# Patient Record
Sex: Female | Born: 1970 | Race: White | Hispanic: No | Marital: Married | Smoking: Former smoker
Health system: Southern US, Community
[De-identification: ages and names within clinical notes are randomized; demographics above are authoritative.]

## PROBLEM LIST (undated history)

## (undated) DIAGNOSIS — Z789 Other specified health status: Secondary | ICD-10-CM

## (undated) HISTORY — PX: WISDOM TOOTH EXTRACTION: SHX21

## (undated) HISTORY — PX: TUBAL LIGATION: SHX77

## (undated) HISTORY — PX: DILATION AND CURETTAGE OF UTERUS: SHX78

---

## 2010-11-12 ENCOUNTER — Emergency Department (HOSPITAL_COMMUNITY)
Admission: EM | Admit: 2010-11-12 | Discharge: 2010-11-12 | Disposition: A | Discharge status: Home / Self Care | Payer: 59 | Attending: Emergency Medicine | Admitting: Emergency Medicine

## 2010-11-12 DIAGNOSIS — S91109A Unspecified open wound of unspecified toe(s) without damage to nail, initial encounter: Secondary | ICD-10-CM | POA: Insufficient documentation

## 2010-11-12 DIAGNOSIS — W268XXA Contact with other sharp object(s), not elsewhere classified, initial encounter: Secondary | ICD-10-CM | POA: Insufficient documentation

## 2010-11-12 DIAGNOSIS — Y92009 Unspecified place in unspecified non-institutional (private) residence as the place of occurrence of the external cause: Secondary | ICD-10-CM | POA: Insufficient documentation

## 2011-06-03 ENCOUNTER — Other Ambulatory Visit: Payer: Self-pay | Admitting: Orthopedic Surgery

## 2011-06-03 DIAGNOSIS — M25512 Pain in left shoulder: Secondary | ICD-10-CM

## 2011-06-10 ENCOUNTER — Ambulatory Visit
Admission: RE | Admit: 2011-06-10 | Discharge: 2011-06-10 | Disposition: A | Discharge status: Home / Self Care | Payer: 59 | Source: Ambulatory Visit | Attending: Orthopedic Surgery | Admitting: Orthopedic Surgery

## 2011-06-10 DIAGNOSIS — M25512 Pain in left shoulder: Secondary | ICD-10-CM

## 2012-04-24 ENCOUNTER — Other Ambulatory Visit: Payer: Self-pay | Admitting: Orthopedic Surgery

## 2012-04-24 DIAGNOSIS — M545 Low back pain, unspecified: Secondary | ICD-10-CM

## 2012-04-24 DIAGNOSIS — R531 Weakness: Secondary | ICD-10-CM

## 2012-04-30 ENCOUNTER — Ambulatory Visit
Admission: RE | Admit: 2012-04-30 | Discharge: 2012-04-30 | Disposition: A | Discharge status: Home / Self Care | Payer: 59 | Source: Ambulatory Visit | Attending: Orthopedic Surgery | Admitting: Orthopedic Surgery

## 2012-04-30 DIAGNOSIS — M545 Low back pain, unspecified: Secondary | ICD-10-CM

## 2012-04-30 DIAGNOSIS — R531 Weakness: Secondary | ICD-10-CM

## 2013-10-18 ENCOUNTER — Ambulatory Visit
Admission: RE | Admit: 2013-10-18 | Discharge: 2013-10-18 | Disposition: A | Discharge status: Home / Self Care | Payer: 59 | Source: Ambulatory Visit | Attending: Family Medicine | Admitting: Family Medicine

## 2013-10-18 ENCOUNTER — Other Ambulatory Visit: Payer: Self-pay | Admitting: Family Medicine

## 2013-10-18 DIAGNOSIS — R1012 Left upper quadrant pain: Secondary | ICD-10-CM

## 2013-10-18 MED ORDER — IOHEXOL 300 MG/ML  SOLN
30.0000 mL | Freq: Once | INTRAMUSCULAR | Status: AC | PRN
  Administered 2013-10-18: 30 mL via ORAL

## 2013-10-18 MED ORDER — IOHEXOL 300 MG/ML  SOLN
100.0000 mL | Freq: Once | INTRAMUSCULAR | Status: AC | PRN
  Administered 2013-10-18: 100 mL via INTRAVENOUS

## 2014-01-22 ENCOUNTER — Other Ambulatory Visit: Payer: Self-pay | Admitting: Orthopedic Surgery

## 2014-01-22 DIAGNOSIS — M7989 Other specified soft tissue disorders: Secondary | ICD-10-CM

## 2014-01-29 ENCOUNTER — Other Ambulatory Visit: Payer: 59

## 2014-02-07 ENCOUNTER — Ambulatory Visit
Admission: RE | Admit: 2014-02-07 | Discharge: 2014-02-07 | Disposition: A | Discharge status: Home / Self Care | Payer: 59 | Source: Ambulatory Visit | Attending: Orthopedic Surgery | Admitting: Orthopedic Surgery

## 2014-02-07 DIAGNOSIS — M7989 Other specified soft tissue disorders: Secondary | ICD-10-CM

## 2014-02-07 MED ORDER — GADOBENATE DIMEGLUMINE 529 MG/ML IV SOLN
13.0000 mL | Freq: Once | INTRAVENOUS | Status: AC | PRN
  Administered 2014-02-07: 13 mL via INTRAVENOUS

## 2014-02-14 ENCOUNTER — Other Ambulatory Visit: Payer: Self-pay | Admitting: Orthopedic Surgery

## 2014-02-19 ENCOUNTER — Encounter (HOSPITAL_BASED_OUTPATIENT_CLINIC_OR_DEPARTMENT_OTHER): Payer: Self-pay | Admitting: *Deleted

## 2014-02-21 ENCOUNTER — Encounter (HOSPITAL_BASED_OUTPATIENT_CLINIC_OR_DEPARTMENT_OTHER): Payer: Self-pay | Admitting: *Deleted

## 2014-02-21 ENCOUNTER — Encounter (HOSPITAL_BASED_OUTPATIENT_CLINIC_OR_DEPARTMENT_OTHER): Payer: 59 | Admitting: Anesthesiology

## 2014-02-21 ENCOUNTER — Ambulatory Visit (HOSPITAL_BASED_OUTPATIENT_CLINIC_OR_DEPARTMENT_OTHER): Payer: 59 | Admitting: Anesthesiology

## 2014-02-21 ENCOUNTER — Encounter (HOSPITAL_BASED_OUTPATIENT_CLINIC_OR_DEPARTMENT_OTHER)
Admission: RE | Disposition: A | Discharge status: Home / Self Care | Payer: Self-pay | Source: Ambulatory Visit | Attending: Orthopedic Surgery

## 2014-02-21 ENCOUNTER — Ambulatory Visit (HOSPITAL_BASED_OUTPATIENT_CLINIC_OR_DEPARTMENT_OTHER)
Admission: RE | Admit: 2014-02-21 | Discharge: 2014-02-21 | Disposition: A | Discharge status: Home / Self Care | Payer: 59 | Source: Ambulatory Visit | Attending: Orthopedic Surgery | Admitting: Orthopedic Surgery

## 2014-02-21 DIAGNOSIS — D481 Neoplasm of uncertain behavior of connective and other soft tissue: Secondary | ICD-10-CM | POA: Insufficient documentation

## 2014-02-21 DIAGNOSIS — M6740 Ganglion, unspecified site: Secondary | ICD-10-CM | POA: Insufficient documentation

## 2014-02-21 DIAGNOSIS — Z882 Allergy status to sulfonamides status: Secondary | ICD-10-CM | POA: Insufficient documentation

## 2014-02-21 DIAGNOSIS — D1779 Benign lipomatous neoplasm of other sites: Secondary | ICD-10-CM | POA: Insufficient documentation

## 2014-02-21 DIAGNOSIS — Z87891 Personal history of nicotine dependence: Secondary | ICD-10-CM | POA: Insufficient documentation

## 2014-02-21 DIAGNOSIS — D4819 Other specified neoplasm of uncertain behavior of connective and other soft tissue: Secondary | ICD-10-CM | POA: Insufficient documentation

## 2014-02-21 HISTORY — DX: Other specified health status: Z78.9

## 2014-02-21 HISTORY — PX: MASS EXCISION: SHX2000

## 2014-02-21 LAB — POCT HEMOGLOBIN-HEMACUE: HEMOGLOBIN: 10.3 g/dL — AB (ref 12.0–15.0)

## 2014-02-21 SURGERY — EXCISION MASS
Anesthesia: General | Site: Finger | Laterality: Bilateral

## 2014-02-21 MED ORDER — DEXAMETHASONE SODIUM PHOSPHATE 4 MG/ML IJ SOLN
INTRAMUSCULAR | Status: DC | PRN
  Administered 2014-02-21: 10 mg via INTRAVENOUS

## 2014-02-21 MED ORDER — 0.9 % SODIUM CHLORIDE (POUR BTL) OPTIME
TOPICAL | Status: DC | PRN
  Administered 2014-02-21: 200 mL

## 2014-02-21 MED ORDER — FENTANYL CITRATE 0.05 MG/ML IJ SOLN
INTRAMUSCULAR | Status: AC
  Filled 2014-02-21: qty 4

## 2014-02-21 MED ORDER — LACTATED RINGERS IV SOLN
INTRAVENOUS | Status: DC
  Administered 2014-02-21: 12:00:00 via INTRAVENOUS

## 2014-02-21 MED ORDER — HYDROCODONE-ACETAMINOPHEN 5-325 MG PO TABS: ORAL_TABLET | ORAL | Status: DC

## 2014-02-21 MED ORDER — ONDANSETRON HCL 4 MG/2ML IJ SOLN
INTRAMUSCULAR | Status: DC | PRN
  Administered 2014-02-21: 4 mg via INTRAVENOUS

## 2014-02-21 MED ORDER — BUPIVACAINE HCL (PF) 0.25 % IJ SOLN
INTRAMUSCULAR | Status: DC | PRN
  Administered 2014-02-21: 15 mL

## 2014-02-21 MED ORDER — LIDOCAINE HCL (CARDIAC) 20 MG/ML IV SOLN
INTRAVENOUS | Status: DC | PRN
Start: 2014-02-21 — End: 2014-02-21
  Administered 2014-02-21: 60 mg via INTRAVENOUS

## 2014-02-21 MED ORDER — MIDAZOLAM HCL 5 MG/5ML IJ SOLN
INTRAMUSCULAR | Status: DC | PRN
  Administered 2014-02-21: 2 mg via INTRAVENOUS

## 2014-02-21 MED ORDER — KETOROLAC TROMETHAMINE 30 MG/ML IJ SOLN
INTRAMUSCULAR | Status: DC | PRN
  Administered 2014-02-21: 30 mg via INTRAVENOUS

## 2014-02-21 MED ORDER — OXYCODONE HCL 5 MG PO TABS: 5.0000 mg | ORAL_TABLET | Freq: Once | ORAL | Status: DC | PRN

## 2014-02-21 MED ORDER — ACETAMINOPHEN 325 MG PO TABS: 325.0000 mg | ORAL_TABLET | ORAL | Status: DC | PRN

## 2014-02-21 MED ORDER — MIDAZOLAM HCL 2 MG/2ML IJ SOLN
INTRAMUSCULAR | Status: AC
  Filled 2014-02-21: qty 2

## 2014-02-21 MED ORDER — CEFAZOLIN SODIUM-DEXTROSE 2-3 GM-% IV SOLR
INTRAVENOUS | Status: AC
  Filled 2014-02-21: qty 50

## 2014-02-21 MED ORDER — PROPOFOL 10 MG/ML IV BOLUS
INTRAVENOUS | Status: DC | PRN
  Administered 2014-02-21: 200 mg via INTRAVENOUS

## 2014-02-21 MED ORDER — CEFAZOLIN SODIUM-DEXTROSE 2-3 GM-% IV SOLR
2.0000 g | INTRAVENOUS | Status: AC
  Administered 2014-02-21: 2 g via INTRAVENOUS

## 2014-02-21 MED ORDER — TOBRAMYCIN-DEXAMETHASONE 0.3-0.1 % OP OINT
TOPICAL_OINTMENT | OPHTHALMIC | Status: AC
Start: 2014-02-21 — End: 2014-02-21
  Filled 2014-02-21: qty 7

## 2014-02-21 MED ORDER — FENTANYL CITRATE 0.05 MG/ML IJ SOLN
INTRAMUSCULAR | Status: AC
  Filled 2014-02-21: qty 2

## 2014-02-21 MED ORDER — BUPIVACAINE HCL (PF) 0.25 % IJ SOLN
INTRAMUSCULAR | Status: AC
  Filled 2014-02-21: qty 30

## 2014-02-21 MED ORDER — CHLORHEXIDINE GLUCONATE 4 % EX LIQD
60.0000 mL | Freq: Once | CUTANEOUS | Status: DC
Start: 2014-02-21 — End: 2014-02-21

## 2014-02-21 MED ORDER — MIDAZOLAM HCL 2 MG/2ML IJ SOLN: 1.0000 mg | INTRAMUSCULAR | Status: DC | PRN

## 2014-02-21 MED ORDER — ACETAMINOPHEN 160 MG/5ML PO SOLN: 325.0000 mg | ORAL | Status: DC | PRN

## 2014-02-21 MED ORDER — PROMETHAZINE HCL 25 MG/ML IJ SOLN: 6.2500 mg | INTRAMUSCULAR | Status: DC | PRN

## 2014-02-21 MED ORDER — KETOROLAC TROMETHAMINE 30 MG/ML IJ SOLN: 15.0000 mg | Freq: Once | INTRAMUSCULAR | Status: DC | PRN

## 2014-02-21 MED ORDER — FENTANYL CITRATE 0.05 MG/ML IJ SOLN
INTRAMUSCULAR | Status: DC | PRN
  Administered 2014-02-21: 100 ug via INTRAVENOUS

## 2014-02-21 MED ORDER — FENTANYL CITRATE 0.05 MG/ML IJ SOLN: 50.0000 ug | INTRAMUSCULAR | Status: DC | PRN

## 2014-02-21 MED ORDER — OXYCODONE HCL 5 MG/5ML PO SOLN: 5.0000 mg | Freq: Once | ORAL | Status: DC | PRN

## 2014-02-21 MED ORDER — FENTANYL CITRATE 0.05 MG/ML IJ SOLN
25.0000 ug | INTRAMUSCULAR | Status: DC | PRN
  Administered 2014-02-21: 25 ug via INTRAVENOUS
  Administered 2014-02-21: 50 ug via INTRAVENOUS

## 2014-02-21 SURGICAL SUPPLY — 48 items
BANDAGE COBAN STERILE 2 (GAUZE/BANDAGES/DRESSINGS) ×2 IMPLANT
BANDAGE ELASTIC 3 VELCRO ST LF (GAUZE/BANDAGES/DRESSINGS) IMPLANT
BENZOIN TINCTURE PRP APPL 2/3 (GAUZE/BANDAGES/DRESSINGS) IMPLANT
BLADE MINI RND TIP GREEN BEAV (BLADE) IMPLANT
BLADE SURG 15 STRL LF DISP TIS (BLADE) ×2 IMPLANT
BLADE SURG 15 STRL SS (BLADE) ×2
BNDG COHESIVE 1X5 TAN STRL LF (GAUZE/BANDAGES/DRESSINGS) IMPLANT
BNDG CONFORM 2 STRL LF (GAUZE/BANDAGES/DRESSINGS) ×4 IMPLANT
BNDG ELASTIC 2 VLCR STRL LF (GAUZE/BANDAGES/DRESSINGS) IMPLANT
BNDG ESMARK 4X9 LF (GAUZE/BANDAGES/DRESSINGS) ×2 IMPLANT
BNDG GAUZE 1X2.1 STRL (MISCELLANEOUS) IMPLANT
BNDG GAUZE ELAST 4 BULKY (GAUZE/BANDAGES/DRESSINGS) IMPLANT
BNDG PLASTER X FAST 3X3 WHT LF (CAST SUPPLIES) IMPLANT
CHLORAPREP W/TINT 26ML (MISCELLANEOUS) ×4 IMPLANT
CORDS BIPOLAR (ELECTRODE) ×2 IMPLANT
COVER MAYO STAND STRL (DRAPES) ×4 IMPLANT
COVER TABLE BACK 60X90 (DRAPES) ×2 IMPLANT
CUFF TOURNIQUET SINGLE 18IN (TOURNIQUET CUFF) ×4 IMPLANT
DRAPE EXTREMITY T 121X128X90 (DRAPE) ×2 IMPLANT
DRAPE SURG 17X23 STRL (DRAPES) ×4 IMPLANT
GAUZE SPONGE 4X4 12PLY STRL (GAUZE/BANDAGES/DRESSINGS) ×2 IMPLANT
GAUZE XEROFORM 1X8 LF (GAUZE/BANDAGES/DRESSINGS) ×2 IMPLANT
GLOVE BIO SURGEON STRL SZ7.5 (GLOVE) ×2 IMPLANT
GLOVE BIOGEL PI IND STRL 8 (GLOVE) ×1 IMPLANT
GLOVE BIOGEL PI INDICATOR 8 (GLOVE) ×1
GLOVE SURG SS PI 7.0 STRL IVOR (GLOVE) ×2 IMPLANT
GOWN STRL REUS W/ TWL LRG LVL3 (GOWN DISPOSABLE) ×1 IMPLANT
GOWN STRL REUS W/TWL LRG LVL3 (GOWN DISPOSABLE) ×1
GOWN STRL REUS W/TWL XL LVL3 (GOWN DISPOSABLE) ×2 IMPLANT
NEEDLE HYPO 25X1 1.5 SAFETY (NEEDLE) ×2 IMPLANT
NS IRRIG 1000ML POUR BTL (IV SOLUTION) ×2 IMPLANT
PACK BASIN DAY SURGERY FS (CUSTOM PROCEDURE TRAY) ×2 IMPLANT
PAD CAST 3X4 CTTN HI CHSV (CAST SUPPLIES) IMPLANT
PAD CAST 4YDX4 CTTN HI CHSV (CAST SUPPLIES) IMPLANT
PADDING CAST ABS 4INX4YD NS (CAST SUPPLIES) ×1
PADDING CAST ABS COTTON 4X4 ST (CAST SUPPLIES) ×1 IMPLANT
PADDING CAST COTTON 3X4 STRL (CAST SUPPLIES)
PADDING CAST COTTON 4X4 STRL (CAST SUPPLIES)
SLEEVE SCD COMPRESS KNEE MED (MISCELLANEOUS) ×2 IMPLANT
STOCKINETTE 4X48 STRL (DRAPES) ×4 IMPLANT
SUT ETHILON 3 0 PS 1 (SUTURE) IMPLANT
SUT ETHILON 4 0 PS 2 18 (SUTURE) IMPLANT
SUT ETHILON 5 0 P 3 18 (SUTURE) ×2
SUT NYLON ETHILON 5-0 P-3 1X18 (SUTURE) ×2 IMPLANT
SYR BULB 3OZ (MISCELLANEOUS) ×2 IMPLANT
SYR CONTROL 10ML LL (SYRINGE) ×2 IMPLANT
TOWEL OR 17X24 6PK STRL BLUE (TOWEL DISPOSABLE) ×4 IMPLANT
UNDERPAD 30X30 INCONTINENT (UNDERPADS AND DIAPERS) ×4 IMPLANT

## 2014-02-21 NOTE — Anesthesia Postprocedure Evaluation (Signed)
  Anesthesia Post-op Note  Patient: Careers information officer  Procedure(s) Performed: Procedure(s): RIGHT INDEX EXCISION MASS/LEFT FINGER EXCISION MASS (Bilateral)  Patient Location: PACU  Anesthesia Type:General  Level of Consciousness: awake and alert   Airway and Oxygen Therapy: Patient Spontanous Breathing  Post-op Pain: mild  Post-op Assessment: Post-op Vital signs reviewed, Patient's Cardiovascular Status Stable, Respiratory Function Stable, Patent Airway, No signs of Nausea or vomiting and Pain level controlled  Post-op Vital Signs: Reviewed and stable  Last Vitals:  Filed Vitals:   02/21/14 1645  BP: 124/79  Pulse: 59  Temp:   Resp: 15    Complications: No apparent anesthesia complications

## 2014-02-21 NOTE — Discharge Instructions (Addendum)

## 2014-02-21 NOTE — Anesthesia Procedure Notes (Signed)
Procedure Name: LMA Insertion Performed by: Terrance Mass Pre-anesthesia Checklist: Patient identified, Timeout performed, Emergency Drugs available, Suction available and Patient being monitored Oxygen Delivery Method: Circle system utilized Preoxygenation: Pre-oxygenation with 100% oxygen Intubation Type: IV induction Ventilation: Mask ventilation without difficulty LMA: LMA inserted LMA Size: 4.0 Number of attempts: 1 Placement Confirmation: positive ETCO2 Tube secured with: Tape Dental Injury: Teeth and Oropharynx as per pre-operative assessment

## 2014-02-21 NOTE — Op Note (Signed)
[id number] 

## 2014-02-21 NOTE — Brief Op Note (Signed)
02/21/2014  4:01 PM  PATIENT:  Tiffany Mccullough  43 y.o. female  PRE-OPERATIVE DIAGNOSIS:  right index finger mass/left small finger mass  POST-OPERATIVE DIAGNOSIS:  right index finger mass/left small finger mass  PROCEDURE:  Procedure(s): RIGHT INDEX EXCISION MASS/LEFT FINGER EXCISION MASS (Bilateral)  SURGEON:  Surgeon(s) and Role:    * Tennis Must, MD - Primary  PHYSICIAN ASSISTANT:   ASSISTANTS: none   ANESTHESIA:   general  EBL:  Total I/O In: 1650 [I.V.:1650] Out: -   BLOOD ADMINISTERED:none  DRAINS: none   LOCAL MEDICATIONS USED:  MARCAINE     SPECIMEN:  Source of Specimen:  left small finger x 1, right index finger x 2  DISPOSITION OF SPECIMEN:  PATHOLOGY  COUNTS:  YES  TOURNIQUET:   Total Tourniquet Time Documented: Forearm (Left) - 9 minutes Forearm (Left) - 38 minutes Total: Forearm (Left) - 47 minutes   DICTATION: .Other Dictation: Dictation Number [id number]  PLAN OF CARE: Discharge to home after PACU  PATIENT DISPOSITION:  PACU - hemodynamically stable.

## 2014-02-21 NOTE — Anesthesia Preprocedure Evaluation (Addendum)
Anesthesia Evaluation  Patient identified by MRN, date of birth, ID band Patient awake    Reviewed: Allergy & Precautions, H&P , NPO status , Patient's Chart, lab work & pertinent test results  History of Anesthesia Complications Negative for: history of anesthetic complications  Airway Mallampati: II TM Distance: >3 FB Neck ROM: Full    Dental   Pulmonary neg shortness of breath, neg sleep apnea, neg COPDneg recent URI, former smoker,  breath sounds clear to auscultation        Cardiovascular negative cardio ROS  Rhythm:Regular     Neuro/Psych negative neurological ROS  negative psych ROS   GI/Hepatic negative GI ROS, Neg liver ROS,   Endo/Other  negative endocrine ROS  Renal/GU negative Renal ROS     Musculoskeletal   Abdominal   Peds  Hematology negative hematology ROS (+)   Anesthesia Other Findings   Reproductive/Obstetrics                          Anesthesia Physical Anesthesia Plan  ASA: I  Anesthesia Plan: General   Post-op Pain Management:    Induction: Intravenous  Airway Management Planned: LMA  Additional Equipment: None  Intra-op Plan:   Post-operative Plan: Extubation in OR  Informed Consent: I have reviewed the patients History and Physical, chart, labs and discussed the procedure including the risks, benefits and alternatives for the proposed anesthesia with the patient or authorized representative who has indicated his/her understanding and acceptance.   Dental advisory given  Plan Discussed with: CRNA and Surgeon  Anesthesia Plan Comments:         Anesthesia Quick Evaluation

## 2014-02-21 NOTE — H&P (Signed)
  Tiffany Mccullough is an 43 y.o. female.   Chief Complaint: right index and left small finger masses HPI: 43 yo rhd female with masses right index and left small fingers x 1 year.  No injury noted.  They are bothersome to her and she wishes to have them removed.  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  Past Medical History  Diagnosis Date  . Medical history non-contributory     Past Surgical History  Procedure Laterality Date  . Wisdom tooth extraction    . Dilation and curettage of uterus    . Tubal ligation      tubal implants    History reviewed. No pertinent family history. Social History:  reports that she quit smoking about 11 years ago. She does not have any smokeless tobacco history on file. She reports that she drinks alcohol. Her drug history is not on file.  Allergies:  Allergies  Allergen Reactions  . Sulfa Antibiotics Rash    Medications Prior to Admission  Medication Sig Dispense Refill  . Multiple Vitamins-Minerals (MULTIVITAMIN WITH MINERALS) tablet Take 1 tablet by mouth daily.        Results for orders placed during the hospital encounter of 02/21/14 (from the past 48 hour(s))  POCT HEMOGLOBIN-HEMACUE     Status: Abnormal   Collection Time    02/21/14 12:23 PM      Result Value Ref Range   Hemoglobin 10.3 (*) 12.0 - 15.0 g/dL    No results found.   A comprehensive review of systems was negative except for: Eyes: positive for contacts/glasses  Blood pressure 119/77, pulse 66, temperature 98.2 F (36.8 C), temperature source Oral, resp. rate 16, height 5\' 3"  (1.6 m), weight 67.756 kg (149 lb 6 oz), last menstrual period 02/20/2014, SpO2 100.00%.  General appearance: alert, cooperative and appears stated age Head: Normocephalic, without obvious abnormality, atraumatic Neck: supple, symmetrical, trachea midline Resp: clear to auscultation bilaterally Cardio: regular rate and rhythm GI: non tender Extremities: intact sensation and  capillary refill all digits.  right index and left small with firm masses.  +epl/fpl/io.  no wounds. Pulses: 2+ and symmetric Skin: Skin color, texture, turgor normal. No rashes or lesions Neurologic: Grossly normal Incision/Wound: none  Assessment/Plan Right index and left small finger masses.  Non operative and operative treatment options were discussed with the patient and patient wishes to proceed with operative treatment. Risks, benefits, and alternatives of surgery were discussed and the patient agrees with the plan of care.   Tennis Must 02/21/2014, 2:37 PM

## 2014-02-21 NOTE — Transfer of Care (Signed)
Immediate Anesthesia Transfer of Care Note  Patient: Tiffany Mccullough  Procedure(s) Performed: Procedure(s): RIGHT INDEX EXCISION MASS/LEFT FINGER EXCISION MASS (Bilateral)  Patient Location: PACU  Anesthesia Type:General  Level of Consciousness: awake  Airway & Oxygen Therapy: Patient Spontanous Breathing and Patient connected to face mask oxygen  Post-op Assessment: Report given to PACU RN and Post -op Vital signs reviewed and stable  Post vital signs: Reviewed and stable  Complications: No apparent anesthesia complications

## 2014-02-22 NOTE — Op Note (Signed)
NAMEERIAL, FIKES NO.:  [id number]  MEDICAL RECORD NO.:  [id number]  LOCATION:                                 FACILITY:  PHYSICIAN:  Leanora Cover, MD             DATE OF BIRTH:  DATE OF PROCEDURE:  02/21/2014 DATE OF DISCHARGE:                              OPERATIVE REPORT   PREOPERATIVE DIAGNOSES:  Right index finger mass and left small finger mass.  POSTOPERATIVE DIAGNOSES:  Left small finger lipoma, right index finger annular ligament cyst, and right index finger giant cell tumor.  PROCEDURE:   1. Excision of mass, left small finger 19 mm 2. Excision of mass right index finger of greater than 2 cm 3. Excision of right index finger annular ligament cyst.  SURGEON:  Leanora Cover, MD  ASSISTANT:  None.  ANESTHESIA:  General.  IV FLUIDS:  Per anesthesia flow sheet.  ESTIMATED BLOOD LOSS:  Minimal.  COMPLICATIONS:  None.  SPECIMENS:  Left small finger mass to Pathology.  Right index finger masses x2 to Pathology.  TOURNIQUET TIME:  9 minutes on the left, 38 minutes on the right.  DISPOSITION:  Stable to PACU.  INDICATIONS:  Ms. Pho is a 43 year old female who has had masses in the left small finger and right index finger for approximately 1 year.  These are bothersome to her.  She wishes to have them removed. MRI of the right index finger is consistent with giant cell tumor.  I discussed the nature of the conditions with Ms. Cortina.  Risks, benefits, and alternatives of surgery were discussed including risk of blood loss, infection, damage to nerves, vessels, tendons, ligaments, bone; failure of surgery; need for additional surgery, complications with wound healing, continued pain, and recurrence of the masses.  She voiced understanding of these risks and elected to proceed.  OPERATIVE COURSE:  After being identified preoperatively by myself, the patient and I agreed upon procedure and site of procedure.  Surgical site was marked.   The risks, benefits, and alternatives of surgery were reviewed and she wished to proceed.  Surgical consent had been signed. She was given IV Ancef as a preoperative antibiotic prophylaxis.  She was transported to the operating room and placed on the operating room table in a supine position with the left upper extremity on arm board. General anesthesia was induced by Anesthesiology.  Left upper extremity was prepped and draped in normal sterile orthopedic fashion.  Surgical pause was performed between surgeons, anesthesia, and operating room staff, and all were in agreement as to the patient, procedure, and site of procedure.  Tourniquet at the proximal aspect of the extremity was inflated to 250 mmHg after exsanguination of the limb with an Esmarch bandage.  Incision was made at the volar aspect of the proximal phalanx of the small finger.  This was carried into subcutaneous tissues by spreading technique.  A fatty mass was encountered and was easily removed.  It was not adherent.  The wound was inspected.  No remaining mass was found.  The wound was copiously irrigated with sterile saline. It was then closed with 5-0 nylon in a horizontal mattress fashion.  The wound was injected with 5 mL of 0.25% plain Marcaine to aid in postoperative analgesia.  It was dressed with sterile Xeroform, 4 x 4 and wrapped with Coban dressing lightly.  Tourniquet was deflated at 9 minutes.  The fingertips were pink with brisk capillary refill after deflation of the tourniquet.  Attention was turned to the right upper extremity.  The right upper extremity was prepped and draped in normal sterile orthopedic fashion.  A surgical pause was again performed between surgeons, anesthesia, and operating room staff, and all were in agreement as to the patient, procedure, and site of procedure. Tourniquet at the proximal aspect of the forearm was inflated to 250 mmHg after exsanguination of the limb with an Esmarch  bandage.  A Brunner-type incision was made at the volar aspect of the index finger and carried into the subcutaneous tissues by spreading technique.  There was a cyst from the interval between the A1 and A2 pulleys.  This was removed and sent to Pathology for examination.  A small window of the pulley was removed to try and prevent recurrence of the cyst.  The mass on the dorsal radial aspect of the finger was palpated.  Incision was made over this dorsally.  This was carried into subcutaneous tissues by spreading technique.  Bipolar electrocautery was used to obtain hemostasis.  Mass was encountered that was underneath the extensor tendon.  The extensor tendon was lifted and the mass freed up from its soft tissue attachments.  Dorsal branch of the radial digital nerve went through the mass and this was freed up.  The mass was removed in its entirety.  There did not appear to be mass coming from the flexor sheath or from the PIP joint.  Mass was sent to Pathology for examination.  It was greater than 2 cm in size.  The wounds were copiously irrigated with sterile saline.  They were then closed with 5-0 nylon in a horizontal mattress fashion.  They were injected with 10 mL of 0.25% plain Marcaine to aid in postoperative analgesia.  They were then dressed with sterile Xeroform, 4x4s, and wrapped with a Coban dressing lightly.  Tourniquet was deflated at 38 minutes.  Fingertips were pink with brisk capillary refill after deflation of the tourniquet.  Operative drapes were broken down.  The patient was awoken from anesthesia safely.  She was transferred back to the stretcher and taken to the PACU in a stable condition.  I will see her back in the office 1 week for postoperative followup.  I will give her [place], one to two p.o. q.6 hours p.r.n. pain, dispensed #[phone].     Leanora Cover, MD     KK/MEDQ  D:  02/21/2014  T:  02/22/2014  Job:  [id number]

## 2014-02-26 ENCOUNTER — Encounter (HOSPITAL_BASED_OUTPATIENT_CLINIC_OR_DEPARTMENT_OTHER): Payer: Self-pay | Admitting: Orthopedic Surgery

## 2014-12-01 ENCOUNTER — Other Ambulatory Visit: Payer: Self-pay | Admitting: Orthopedic Surgery

## 2014-12-01 DIAGNOSIS — M25512 Pain in left shoulder: Secondary | ICD-10-CM

## 2014-12-18 ENCOUNTER — Ambulatory Visit
Admission: RE | Admit: 2014-12-18 | Discharge: 2014-12-18 | Disposition: A | Discharge status: Home / Self Care | Payer: 59 | Source: Ambulatory Visit | Attending: Orthopedic Surgery | Admitting: Orthopedic Surgery

## 2014-12-18 DIAGNOSIS — M25512 Pain in left shoulder: Secondary | ICD-10-CM

## 2014-12-18 MED ORDER — IOHEXOL 180 MG/ML  SOLN
20.0000 mL | Freq: Once | INTRAMUSCULAR | Status: AC | PRN
  Administered 2014-12-18: 20 mL via INTRA_ARTICULAR

## 2020-12-17 ENCOUNTER — Other Ambulatory Visit: Payer: Self-pay

## 2020-12-17 ENCOUNTER — Ambulatory Visit: Payer: Managed Care, Other (non HMO) | Admitting: Orthopedic Surgery

## 2020-12-17 ENCOUNTER — Encounter: Payer: Self-pay | Admitting: Orthopedic Surgery

## 2020-12-17 ENCOUNTER — Ambulatory Visit (INDEPENDENT_AMBULATORY_CARE_PROVIDER_SITE_OTHER): Payer: Managed Care, Other (non HMO)

## 2020-12-17 DIAGNOSIS — M25512 Pain in left shoulder: Secondary | ICD-10-CM | POA: Diagnosis not present

## 2020-12-17 DIAGNOSIS — G8929 Other chronic pain: Secondary | ICD-10-CM | POA: Diagnosis not present

## 2020-12-17 DIAGNOSIS — Z96612 Presence of left artificial shoulder joint: Secondary | ICD-10-CM

## 2020-12-17 NOTE — Progress Notes (Signed)
Office Visit Note   Patient: Tiffany Mccullough           Date of Birth: 1971-10-10           MRN: [id number] Visit Date: 12/17/2020 Requested by: Alroy Dust, L.Marlou Sa, [place] [address] [employer] [address] [place],  [place] [address] PCP: Alroy Dust, L.Marlou Sa, MD  Subjective: Chief Complaint  Patient presents with  . Left Shoulder - Pain    HPI: Abe People is a 50 year old patient with left shoulder pain.  Her old notes are reviewed.  She had shoulder pain in 2016 with MRI scan which showed some tendinitis.  Did well with injection.  Now she is having more mechanical symptoms in the shoulder for the last 6 months.  Reports decreased range of motion decreased strength.  Pain wakes her from sleep at night.  She has popping.  She does work as a Engineering geologist.  This involves a lot of overhead moving which does hurt her shoulder particular going across her body.              ROS: All systems reviewed are negative as they relate to the chief complaint within the history of present illness.  Patient denies  fevers or chills.   Assessment & Plan: Visit Diagnoses:  1. Chronic left shoulder pain     Plan: Impression is left shoulder pain with new mechanical symptoms on exam.  Rotator cuff strength is intact.  I think this likely represents labral pathology.  Radiographs are normal.  Ibuprofen has not helped.  She has tried activity modification along with rest as well as home rehab type program without relief.  Plan MRI arthrogram left shoulder to evaluate for SLAP tear versus rotator cuff tendon tear.  I think SLAP tear is most likely based on exam today.  Follow-up after that study.  Follow-Up Instructions: Return for after MRI.   Orders:  Orders Placed This Encounter  Procedures  . XR Shoulder Left   No orders of the defined types were placed in this encounter.     Procedures: No procedures performed   Clinical Data: No additional findings.  Objective: Vital Signs: There were no vitals  taken for this visit.  Physical Exam:   Constitutional: Patient appears well-developed HEENT:  Head: Normocephalic Eyes:EOM are normal Neck: Normal range of motion Cardiovascular: Normal rate Pulmonary/chest: Effort normal Neurologic: Patient is alert Skin: Skin is warm Psychiatric: Patient has normal mood and affect    Ortho Exam: Ortho exam demonstrates full active and passive range of motion of the left shoulder.  She has some coarse grinding and popping intra-articular with internal/external rotation at 90 degrees of abduction.  Rotator cuff strength intact infraspinatus supraspinatus and subscap muscle testing.  No masses lymphadenopathy or skin changes noted in that shoulder girdle region.  O'Brien's testing is positive.  No discrete AC joint tenderness on the left side.  Negative apprehension relocation testing.  Specialty Comments:  No specialty comments available.  Imaging: XR Shoulder Left  Result Date: 12/17/2020 AP axillary outlet left shoulder reviewed.  No acute fracture.  Shoulder joint is reduced.  No AC joint or glenohumeral joint arthritis.  Acromiohumeral distance maintained.  Patient has lung fields clear.  Normal left shoulder radiographs    PMFS History: There are no problems to display for this patient.  Past Medical History:  Diagnosis Date  . Medical history non-contributory     History reviewed. No pertinent family history.  Past Surgical History:  Procedure Laterality Date  .  DILATION AND CURETTAGE OF UTERUS    . MASS EXCISION Bilateral 02/21/2014   Procedure: RIGHT INDEX EXCISION MASS/LEFT FINGER EXCISION MASS;  Surgeon: Tennis Must, MD;  Location: [facility];  Service: Orthopedics;  Laterality: Bilateral;  . TUBAL LIGATION     tubal implants  . WISDOM TOOTH EXTRACTION     Social History   Occupational History  . Not on file  Tobacco Use  . Smoking status: Former Smoker    Quit date: 01/21/2003    Years since quitting:  17.9  . Smokeless tobacco: Not on file  Substance and Sexual Activity  . Alcohol use: Yes  . Drug use: Not on file  . Sexual activity: Not on file

## 2021-01-06 ENCOUNTER — Telehealth: Payer: Self-pay | Admitting: Orthopedic Surgery

## 2021-01-06 ENCOUNTER — Other Ambulatory Visit: Payer: Self-pay | Admitting: Surgical

## 2021-01-06 MED ORDER — DIAZEPAM 5 MG PO TABS: 5.0000 mg | ORAL_TABLET | Freq: Once | ORAL | 0 refills | Status: AC

## 2021-01-06 NOTE — Telephone Encounter (Signed)
Please advise 

## 2021-01-06 NOTE — Telephone Encounter (Signed)
Pt called stating she has an MRI on 01/09/21 and would like something to help with her anxiety called in; pt would like to be updated if this is possible or not.   [phone]

## 2021-01-06 NOTE — Telephone Encounter (Signed)
Sent in RX

## 2021-01-09 ENCOUNTER — Other Ambulatory Visit: Payer: Self-pay

## 2021-01-09 ENCOUNTER — Ambulatory Visit
Admission: RE | Admit: 2021-01-09 | Discharge: 2021-01-09 | Disposition: A | Discharge status: Home / Self Care | Payer: Managed Care, Other (non HMO) | Source: Ambulatory Visit | Attending: Orthopedic Surgery | Admitting: Orthopedic Surgery

## 2021-01-09 DIAGNOSIS — Z96612 Presence of left artificial shoulder joint: Secondary | ICD-10-CM

## 2021-01-25 ENCOUNTER — Ambulatory Visit: Payer: Self-pay

## 2021-01-25 ENCOUNTER — Ambulatory Visit (INDEPENDENT_AMBULATORY_CARE_PROVIDER_SITE_OTHER): Payer: Managed Care, Other (non HMO) | Admitting: Orthopedic Surgery

## 2021-01-25 DIAGNOSIS — M25512 Pain in left shoulder: Secondary | ICD-10-CM | POA: Diagnosis not present

## 2021-01-25 DIAGNOSIS — G8929 Other chronic pain: Secondary | ICD-10-CM

## 2021-01-25 DIAGNOSIS — M19012 Primary osteoarthritis, left shoulder: Secondary | ICD-10-CM

## 2021-01-31 ENCOUNTER — Encounter: Payer: Self-pay | Admitting: Orthopedic Surgery

## 2021-01-31 MED ORDER — METHYLPREDNISOLONE ACETATE 40 MG/ML IJ SUSP
13.3300 mg | INTRAMUSCULAR | Status: AC | PRN
  Administered 2021-01-25: 13.33 mg via INTRA_ARTICULAR

## 2021-01-31 MED ORDER — LIDOCAINE HCL 1 % IJ SOLN
3.0000 mL | INTRAMUSCULAR | Status: AC | PRN
  Administered 2021-01-25: 3 mL

## 2021-01-31 MED ORDER — BUPIVACAINE HCL 0.25 % IJ SOLN
0.6600 mL | INTRAMUSCULAR | Status: AC | PRN
  Administered 2021-01-25: .66 mL via INTRA_ARTICULAR

## 2021-01-31 NOTE — Progress Notes (Signed)
Office Visit Note   Patient: Tiffany Mccullough           Date of Birth: [birth year]           MRN: [id number] Visit Date: 01/25/2021 Requested by: Alroy Dust, L.Marlou Sa, [place] [address] [place],  [place] [address] PCP: Alroy Dust, L.Marlou Sa, MD  Subjective: Chief Complaint  Patient presents with  . Other    Scan review    HPI: Abe People is a 50 year old patient with left superior shoulder pain.  Ibuprofen has not been helpful.  Hard for her to sleep on the left-hand side.  She does x-rays at [facility] which are portables which does tax her shoulder to some degree.  2 prior injections have helped.  MRI scan is reviewed today.  Rotator cuff intact.  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              ROS: All systems reviewed are negative as they relate to the chief complaint within the history of present illness.  Patient denies  fevers or chills.   Assessment & Plan: Visit Diagnoses:  1. Chronic left shoulder pain     Plan: Impression is left shoulder pain with AC joint arthropathy.  Last injection many months ago.  Patient does not really want to consider operative intervention yet.  She may actually do a less demanding physical job.  Ultrasound-guided injection performed today.  Follow-up as needed but I think surgical consideration could be given if this injection does not last.  Follow-Up Instructions: No follow-ups on file.   Orders:  Orders Placed This Encounter  Procedures  . US Guided Needle Placement - No Linked Charges   No orders of the defined types were placed in this encounter.     Procedures: Medium Joint Inj: L acromioclavicular on 01/25/2021 4:00 PM Indications: diagnostic evaluation and pain Details: 25 G 1.5 in needle, ultrasound-guided superior approach Medications: 3 mL lidocaine 1 %; 0.66 mL bupivacaine 0.25 %; 13.33 mg methylPREDNISolone acetate 40 MG/ML Outcome: tolerated well, no immediate  complications Procedure, treatment alternatives, risks and benefits explained, specific risks discussed. Consent was given by the patient. Immediately prior to procedure a time out was called to verify the correct patient, procedure, equipment, support staff and site/side marked as required. Patient was prepped and draped in the usual sterile fashion.       Clinical Data: No additional findings.  Objective: Vital Signs: There were no vitals taken for this visit.  Physical Exam:   Constitutional: Patient appears well-developed HEENT:  Head: Normocephalic Eyes:EOM are normal Neck: Normal range of motion Cardiovascular: Normal rate Pulmonary/chest: Effort normal Neurologic: Patient is alert Skin: Skin is warm Psychiatric: Patient has normal mood and affect    Ortho Exam: Ortho exam demonstrates full active and passive range of motion on that left-hand side.  No restriction of external rotation 15 degrees of abduction left versus right.  Rotator cuff strength on the left is excellent infraspinatus supraspinatus and subscap muscle testing.  AC joint is tender to palpation on the left not on the right.  No masses lymphadenopathy or skin changes noted in that left shoulder girdle region.  Specialty Comments:  No specialty comments available.  Imaging: No results found.   PMFS History: There are no problems to display for this patient.  Past Medical History:  Diagnosis Date  . Medical history non-contributory     No family history on file.  Past Surgical History:  Procedure Laterality Date  . DILATION AND CURETTAGE OF UTERUS    . MASS EXCISION Bilateral 02/21/2014   Procedure: RIGHT INDEX EXCISION MASS/LEFT FINGER EXCISION MASS;  Surgeon: Tennis Must, MD;  Location: [facility];  Service: Orthopedics;  Laterality: Bilateral;  . TUBAL LIGATION     tubal implants  . WISDOM TOOTH EXTRACTION     Social History   Occupational History  . Not on file  Tobacco  Use  . Smoking status: Former Smoker    Quit date: 01/21/2003    Years since quitting: 18.0  . Smokeless tobacco: Not on file  Substance and Sexual Activity  . Alcohol use: Yes  . Drug use: Not on file  . Sexual activity: Not on file

## 2021-03-25 ENCOUNTER — Ambulatory Visit: Payer: Managed Care, Other (non HMO) | Admitting: Orthopedic Surgery

## 2021-03-25 ENCOUNTER — Ambulatory Visit (INDEPENDENT_AMBULATORY_CARE_PROVIDER_SITE_OTHER): Payer: Managed Care, Other (non HMO)

## 2021-03-25 ENCOUNTER — Other Ambulatory Visit: Payer: Self-pay

## 2021-03-25 ENCOUNTER — Encounter: Payer: Self-pay | Admitting: Orthopedic Surgery

## 2021-03-25 DIAGNOSIS — M79605 Pain in left leg: Secondary | ICD-10-CM

## 2021-03-25 DIAGNOSIS — M5410 Radiculopathy, site unspecified: Secondary | ICD-10-CM

## 2021-03-25 NOTE — Progress Notes (Signed)
Office Visit Note   Patient: Tiffany Mccullough           Date of Birth: 12-May-1971           MRN: [id number] Visit Date: 03/25/2021 Requested by: Alroy Dust, L.Marlou Sa, [employer] [place],  [place] [address] PCP: Alroy Dust, L.Marlou Sa, MD  Subjective: Chief Complaint  Patient presents with   Left Leg - Pain    HPI: Tiffany Mccullough is a 50 y.o. female who presents to the office complaining of left hip and buttock pain.  Patient reports a fall December 2019 when she fell and felt a pop in her left hip.  She notes difficulty weightbearing for several days after that incident and she went to a walk-in clinic at the time where she had negative radiographs.  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  This involves a lot of walking and she states that her pain is never really gone away since this initial injury.  Complains of lateral left hip pain as well as anterior groin pain.  She also has associated buttock pain that radiates down to her knee and occasionally down to the bottom of her foot.  She has no history of hip or back surgery but she has had lumbar spine ESI's years ago with last MRI of the lumbar spine in 2013.  She notes pain in the lateral hip with pushing off and she feels pain is so severe that she cannot even do a stationary bike.  [place] is particularly hard and down hills is not difficult.  She notes her leg will go numb at night especially in the lateral thigh.  Pain bothers her every day.  She has associated low back pain..                ROS: All systems reviewed are negative as they relate to the chief complaint within the history of present illness.  Patient denies fevers or chills.  Assessment & Plan: Visit Diagnoses:  1. Pain in left leg   2. Radicular syndrome of left leg     Plan: Patient is a 50 year old female who presents complaining of left hip and low back pain with  left leg radicular pain.  She has had pain for 2 years in the left hip in particular since a fall with negative radiographs at the time.  Radiographs taken today are negative for any pathology to explain her pain.  Additionally, she has what sounds like radicular pain by her history.  Given the radicular pain down her leg as well as her history of lumbar spine ESI in the past, plan to order new MRI of the lumbar spine and MRI of the left hip for further evaluation.  Follow-up after MRI to review results.  Patient agreed with plan.  Follow-Up Instructions: No follow-ups on file.   Orders:  Orders Placed This Encounter  Procedures   XR Lumbar Spine 2-3 Views   XR HIP UNILAT W OR W/O PELVIS 2-3 VIEWS LEFT   MR Lumbar Spine w/o contrast   MR Hip Left w/o contrast   No orders of the defined types were placed in this encounter.     Procedures: No procedures performed   Clinical Data: No additional findings.  Objective: Vital Signs: There were no vitals taken for this visit.  Physical Exam:  Constitutional: Patient appears well-developed HEENT:  Head: Normocephalic  Eyes:EOM are normal Neck: Normal range of motion Cardiovascular: Normal rate Pulmonary/chest: Effort normal Neurologic: Patient is alert Skin: Skin is warm Psychiatric: Patient has normal mood and affect  Ortho Exam: Ortho exam demonstrates negative straight leg raise bilaterally.  Moderate tenderness over the greater trochanter on the left.  No tenderness over the trochanter on the right.  Increased pain in the left groin with internal rotation of the hip and forward flexion of the hip.  Positive Stinchfield sign.  No tenderness throughout the axial lumbar spine.  Normoreflexic patellar tendon reflexes bilaterally.  No calf tenderness.  Negative Homans' sign.  5/5 motor strength of bilateral hip flexor, quadricep, hamstring, dorsiflexion, plantarflexion.  Specialty Comments:  No specialty comments  available.  Imaging: No results found.   PMFS History: There are no problems to display for this patient.  Past Medical History:  Diagnosis Date   Medical history non-contributory     No family history on file.  Past Surgical History:  Procedure Laterality Date   DILATION AND CURETTAGE OF UTERUS     MASS EXCISION Bilateral 02/21/2014   Procedure: RIGHT INDEX EXCISION MASS/LEFT FINGER EXCISION MASS;  Surgeon: Tennis Must, MD;  Location: [facility];  Service: Orthopedics;  Laterality: Bilateral;   TUBAL LIGATION     tubal implants   WISDOM TOOTH EXTRACTION     Social History   Occupational History   Not on file  Tobacco Use   Smoking status: Former    Pack years: 0.00    Types: Cigarettes    Quit date: 01/21/2003    Years since quitting: 18.1   Smokeless tobacco: Not on file  Substance and Sexual Activity   Alcohol use: Yes   Drug use: Not on file   Sexual activity: Not on file

## 2021-04-07 ENCOUNTER — Telehealth: Payer: Self-pay | Admitting: Orthopedic Surgery

## 2021-04-07 NOTE — Telephone Encounter (Signed)
Patient called. Says her insurance denied her MRI. Would like to know what she should do. Her call back number is [phone]

## 2021-04-07 NOTE — Telephone Encounter (Signed)
Has ac joint arthropathy has had scan

## 2021-04-10 NOTE — Telephone Encounter (Signed)
Luke ordered them she may need to try and fail 6 weeks of therapy and conservative rx with documentation  Lurena Joiner can u review and call her thx

## 2021-04-13 ENCOUNTER — Other Ambulatory Visit: Payer: Self-pay

## 2021-04-13 ENCOUNTER — Encounter (HOSPITAL_COMMUNITY): Payer: Self-pay

## 2021-04-13 ENCOUNTER — Emergency Department (HOSPITAL_COMMUNITY)
Admission: EM | Admit: 2021-04-13 | Discharge: 2021-04-13 | Disposition: A | Discharge status: Home / Self Care | Payer: Managed Care, Other (non HMO) | Attending: Emergency Medicine | Admitting: Emergency Medicine

## 2021-04-13 DIAGNOSIS — S61511A Laceration without foreign body of right wrist, initial encounter: Secondary | ICD-10-CM | POA: Diagnosis not present

## 2021-04-13 DIAGNOSIS — Z203 Contact with and (suspected) exposure to rabies: Secondary | ICD-10-CM | POA: Insufficient documentation

## 2021-04-13 DIAGNOSIS — Z2914 Encounter for prophylactic rabies immune globin: Secondary | ICD-10-CM | POA: Insufficient documentation

## 2021-04-13 DIAGNOSIS — S20312A Abrasion of left front wall of thorax, initial encounter: Secondary | ICD-10-CM | POA: Insufficient documentation

## 2021-04-13 DIAGNOSIS — S21152A Open bite of left front wall of thorax without penetration into thoracic cavity, initial encounter: Secondary | ICD-10-CM | POA: Insufficient documentation

## 2021-04-13 DIAGNOSIS — W5551XA Bitten by raccoon, initial encounter: Secondary | ICD-10-CM | POA: Diagnosis not present

## 2021-04-13 DIAGNOSIS — W5552XA Struck by raccoon, initial encounter: Secondary | ICD-10-CM

## 2021-04-13 DIAGNOSIS — Z87891 Personal history of nicotine dependence: Secondary | ICD-10-CM | POA: Diagnosis not present

## 2021-04-13 DIAGNOSIS — S61551A Open bite of right wrist, initial encounter: Secondary | ICD-10-CM | POA: Diagnosis present

## 2021-04-13 DIAGNOSIS — Z23 Encounter for immunization: Secondary | ICD-10-CM | POA: Insufficient documentation

## 2021-04-13 MED ORDER — AMOXICILLIN-POT CLAVULANATE 875-125 MG PO TABS
1.0000 | ORAL_TABLET | Freq: Once | ORAL | Status: AC
  Administered 2021-04-13: 1 via ORAL
  Filled 2021-04-13: qty 1

## 2021-04-13 MED ORDER — RABIES IMMUNE GLOBULIN 150 UNIT/ML IM INJ
20.0000 [IU]/kg | INJECTION | Freq: Once | INTRAMUSCULAR | Status: AC
  Administered 2021-04-13: 1575 [IU] via INTRAMUSCULAR
  Filled 2021-04-13: qty 20

## 2021-04-13 MED ORDER — RABIES VACCINE, PCEC IM SUSR
1.0000 mL | Freq: Once | INTRAMUSCULAR | Status: AC
  Administered 2021-04-13: 1 mL via INTRAMUSCULAR
  Filled 2021-04-13: qty 1

## 2021-04-13 MED ORDER — BACITRACIN ZINC 500 UNIT/GM EX OINT
TOPICAL_OINTMENT | Freq: Two times a day (BID) | CUTANEOUS | Status: DC
  Filled 2021-04-13: qty 2.7

## 2021-04-13 MED ORDER — AMOXICILLIN-POT CLAVULANATE 875-125 MG PO TABS: 1.0000 | ORAL_TABLET | Freq: Two times a day (BID) | ORAL | 0 refills | Status: DC

## 2021-04-13 MED ORDER — TETANUS-DIPHTH-ACELL PERTUSSIS 5-2.5-18.5 LF-MCG/0.5 IM SUSY
0.5000 mL | PREFILLED_SYRINGE | Freq: Once | INTRAMUSCULAR | Status: AC
  Administered 2021-04-13: 0.5 mL via INTRAMUSCULAR
  Filled 2021-04-13: qty 0.5

## 2021-04-13 NOTE — Discharge Instructions (Addendum)
Use the attached information to complete the rest of your rabies vaccine series shots. Take antibiotics as prescribed. Keep the area clean with antibiotic ointment. Return to the ER for signs of infection including redness, drainage from the area, fever or swelling.

## 2021-04-13 NOTE — Telephone Encounter (Signed)
Please see message from Dr. Marlou Sa below and advise.

## 2021-04-13 NOTE — ED Provider Notes (Signed)
[facility] DEPT Provider Note   CSN: [id number] Arrival date & time: 04/13/21  1849     History Chief Complaint  Patient presents with   Animal Bite    Tiffany Mccullough is a 50 y.o. female.   Animal Bite Contact animal:  Raccoon Location:  Shoulder/arm and torso Shoulder/arm injury location:  R forearm Torso injury location:  L chest Time since incident:  2 hours Pain details:    Severity:  Mild Incident location:  Home Provoked: unprovoked   Notifications:  None Animal's rabies vaccination status:  Unknown Animal in possession: no   Ineffective treatments:  None tried Associated symptoms: no fever       Past Medical History:  Diagnosis Date   Medical history non-contributory     There are no problems to display for this patient.   Past Surgical History:  Procedure Laterality Date   DILATION AND CURETTAGE OF UTERUS     MASS EXCISION Bilateral 02/21/2014   Procedure: RIGHT INDEX EXCISION MASS/LEFT FINGER EXCISION MASS;  Surgeon: Tennis Must, MD;  Location: [facility];  Service: Orthopedics;  Laterality: Bilateral;   TUBAL LIGATION     tubal implants   WISDOM TOOTH EXTRACTION       OB History   No obstetric history on file.     No family history on file.  Social History   Tobacco Use   Smoking status: Former    Pack years: 0.00    Types: Cigarettes    Quit date: 01/21/2003    Years since quitting: 18.2   Smokeless tobacco: Never  Substance Use Topics   Alcohol use: Yes    Home Medications Prior to Admission medications   Medication Sig Start Date End Date Taking? Authorizing Provider  acetaminophen (TYLENOL) 500 MG tablet Take 500-1,000 mg by mouth every 6 (six) hours as needed for mild pain.   Yes [provider]  amoxicillin-clavulanate (AUGMENTIN) 875-125 MG tablet Take 1 tablet by mouth every 12 (twelve) hours. 04/13/21  Yes Don Tiu, PA-C  levothyroxine (SYNTHROID) 25 MCG  tablet Take 25 mcg by mouth daily before breakfast. 04/12/21  Yes [provider]  HYDROcodone-acetaminophen (NORCO) 5-325 MG per tablet 1-2 tabs po q6 hours prn pain Patient not taking: Reported on 04/13/2021 02/21/14   Leanora Cover, MD  Multiple Vitamins-Minerals (MULTIVITAMIN WITH MINERALS) tablet Take 1 tablet by mouth daily.    [provider]    Allergies    Wellbutrin [bupropion] and Sulfa antibiotics  Review of Systems   Review of Systems  Constitutional:  Negative for fever.  Respiratory:  Negative for shortness of breath.   Cardiovascular:  Negative for chest pain.  Skin:  Positive for wound.   Physical Exam Updated Vital Signs BP (!) 144/98 (BP Location: Right Arm)   Pulse 87   Temp 98.1 F (36.7 C) (Oral)   Resp 18   Ht 5\' 3"  (1.6 m)   Wt 77.1 kg   SpO2 100%   BMI 30.11 kg/m   Physical Exam Vitals and nursing note reviewed.  Constitutional:      General: She is not in acute distress.    Appearance: She is well-developed. She is not diaphoretic.  HENT:     Head: Normocephalic and atraumatic.  Eyes:     General: No scleral icterus.    Conjunctiva/sclera: Conjunctivae normal.  Pulmonary:     Effort: Pulmonary effort is normal. No respiratory distress.  Musculoskeletal:     Cervical  back: Normal range of motion.  Skin:    Findings: Abrasion and wound present. No rash.     Comments: Superficial laceration noted to right wrist.  There is an abrasion noted to the left chest wall area.  No active bleeding.  Normal range of motion of extremities.  Neurological:     Mental Status: She is alert.    ED Results / Procedures / Treatments   Labs (all labs ordered are listed, but only abnormal results are displayed) Labs Reviewed - No data to display  EKG None  Radiology No results found.  Procedures Procedures   Medications Ordered in ED Medications  rabies immune globulin (HYPERAB/KEDRAB) injection 1,575 Units (has no administration in time  range)  rabies vaccine (RABAVERT) injection 1 mL (has no administration in time range)  amoxicillin-clavulanate (AUGMENTIN) 875-125 MG per tablet 1 tablet (has no administration in time range)  Tdap (BOOSTRIX) injection 0.5 mL (has no administration in time range)  bacitracin ointment (has no administration in time range)    ED Course  I have reviewed the triage vital signs and the nursing notes.  Pertinent labs & imaging results that were available during my care of the patient were reviewed by me and considered in my medical decision making (see chart for details).    MDM Rules/Calculators/A&P                          50 year old female presenting to the ED after being bitten and scratched by a raccoon prior to arrival.  She took a shower at home and cleaned the wounds.  She is unsure of the vaccination status of the raccoon as it was in her yard.  No active bleeding noted at this time.  There are superficial abrasions noted to the left chest wall and right arm that do not require suturing at this time area will be cleaned, she will be given Augmentin, rabies vaccine and Tdap given here.  Her last Tdap was in 2013 I feel that it is best to update this here given the mechanism of her wounds.  We will give her the remainder of rabies vaccine course information and remainder of antibiotic course.  Advised to keep the area clean with antibiotic ointment.  Return precautions given.   Patient is hemodynamically stable, in NAD, and able to ambulate in the ED. Evaluation does not show pathology that would require ongoing emergent intervention or inpatient treatment. I explained the diagnosis to the patient. Pain has been managed and has no complaints prior to discharge. Patient is comfortable with above plan and is stable for discharge at this time. All questions were answered prior to disposition. Strict return precautions for returning to the ED were discussed. Encouraged follow up with PCP.   An  After Visit Summary was printed and given to the patient.   Portions of this note were generated with [employer]. Dictation errors may occur despite best attempts at proofreading.  Final Clinical Impression(s) / ED Diagnoses Final diagnoses:  Struck by raccoon, initial encounter    Rx / DC Orders ED Discharge Orders          Ordered    amoxicillin-clavulanate (AUGMENTIN) 875-125 MG tablet  Every 12 hours        04/13/21 2040             Delia Heady, PA-C 04/13/21 2115    Fredia Sorrow, MD 04/16/21 [phone]

## 2021-04-13 NOTE — ED Triage Notes (Signed)
Pt to ED with c/o Racoon bite. Pt was gardening when she heard what sounded like a puppy. When she went to look she was attacked by a wet racoon. Pt has lacerations to her left chest and a laceration to her right arm, bleeding is controlled.

## 2021-04-16 ENCOUNTER — Other Ambulatory Visit: Payer: Self-pay

## 2021-04-16 ENCOUNTER — Ambulatory Visit (HOSPITAL_COMMUNITY)
Admission: EM | Admit: 2021-04-16 | Discharge: 2021-04-16 | Disposition: A | Discharge status: Home / Self Care | Payer: Managed Care, Other (non HMO) | Attending: Internal Medicine | Admitting: Internal Medicine

## 2021-04-16 DIAGNOSIS — Z203 Contact with and (suspected) exposure to rabies: Secondary | ICD-10-CM | POA: Diagnosis not present

## 2021-04-16 DIAGNOSIS — M5410 Radiculopathy, site unspecified: Secondary | ICD-10-CM

## 2021-04-16 DIAGNOSIS — M79605 Pain in left leg: Secondary | ICD-10-CM

## 2021-04-16 MED ORDER — RABIES VACCINE, PCEC IM SUSR
1.0000 mL | Freq: Once | INTRAMUSCULAR | Status: AC
  Administered 2021-04-16: 1 mL via INTRAMUSCULAR

## 2021-04-16 MED ORDER — RABIES VACCINE, PCEC IM SUSR
INTRAMUSCULAR | Status: AC
  Filled 2021-04-16: qty 1

## 2021-04-16 NOTE — ED Triage Notes (Signed)
Here for day 3 rabies vaccine, does not wish to see provider

## 2021-04-20 ENCOUNTER — Ambulatory Visit (HOSPITAL_COMMUNITY)
Admission: EM | Admit: 2021-04-20 | Discharge: 2021-04-20 | Disposition: A | Discharge status: Home / Self Care | Payer: Managed Care, Other (non HMO) | Attending: Internal Medicine | Admitting: Internal Medicine

## 2021-04-20 ENCOUNTER — Other Ambulatory Visit: Payer: Self-pay

## 2021-04-20 DIAGNOSIS — Z203 Contact with and (suspected) exposure to rabies: Secondary | ICD-10-CM

## 2021-04-20 DIAGNOSIS — Z23 Encounter for immunization: Secondary | ICD-10-CM

## 2021-04-20 MED ORDER — RABIES VACCINE, PCEC IM SUSR
INTRAMUSCULAR | Status: AC
  Filled 2021-04-20: qty 1

## 2021-04-20 MED ORDER — RABIES VACCINE, PCEC IM SUSR
1.0000 mL | Freq: Once | INTRAMUSCULAR | Status: AC
Start: 2021-04-20 — End: 2021-04-20
  Administered 2021-04-20: 1 mL via INTRAMUSCULAR

## 2021-04-20 NOTE — ED Triage Notes (Signed)
Pt presents for day 3 of Rabies vaccine.

## 2021-04-27 ENCOUNTER — Other Ambulatory Visit: Payer: Self-pay

## 2021-04-27 ENCOUNTER — Ambulatory Visit (HOSPITAL_COMMUNITY)
Admission: EM | Admit: 2021-04-27 | Discharge: 2021-04-27 | Disposition: A | Discharge status: Home / Self Care | Payer: Managed Care, Other (non HMO) | Attending: Internal Medicine | Admitting: Internal Medicine

## 2021-04-27 DIAGNOSIS — Z203 Contact with and (suspected) exposure to rabies: Secondary | ICD-10-CM

## 2021-04-27 MED ORDER — RABIES VACCINE, PCEC IM SUSR
INTRAMUSCULAR | Status: AC
  Filled 2021-04-27: qty 1

## 2021-04-27 MED ORDER — RABIES VACCINE, PCEC IM SUSR
1.0000 mL | Freq: Once | INTRAMUSCULAR | Status: AC
  Administered 2021-04-27: 1 mL via INTRAMUSCULAR

## 2021-04-27 NOTE — ED Triage Notes (Signed)
Pt presents for day 7 of Rabies vaccine.

## 2021-05-03 ENCOUNTER — Encounter: Payer: Self-pay | Admitting: *Deleted

## 2021-05-17 ENCOUNTER — Other Ambulatory Visit: Payer: Self-pay

## 2021-05-17 ENCOUNTER — Encounter: Payer: Self-pay | Admitting: Physical Therapy

## 2021-05-17 ENCOUNTER — Ambulatory Visit: Payer: Managed Care, Other (non HMO) | Attending: Orthopedic Surgery | Admitting: Physical Therapy

## 2021-05-17 DIAGNOSIS — M6281 Muscle weakness (generalized): Secondary | ICD-10-CM | POA: Insufficient documentation

## 2021-05-17 DIAGNOSIS — G8929 Other chronic pain: Secondary | ICD-10-CM | POA: Diagnosis present

## 2021-05-17 DIAGNOSIS — R262 Difficulty in walking, not elsewhere classified: Secondary | ICD-10-CM | POA: Diagnosis present

## 2021-05-17 DIAGNOSIS — M5442 Lumbago with sciatica, left side: Secondary | ICD-10-CM | POA: Insufficient documentation

## 2021-05-17 DIAGNOSIS — M25552 Pain in left hip: Secondary | ICD-10-CM | POA: Diagnosis present

## 2021-05-17 NOTE — Patient Instructions (Signed)
Access Code: [email or web address] URL: [email or web address] Date: 05/17/2021 Prepared by: Grayling Congress  Exercises Supine Piriformis Stretch with Foot on Ground - 2 x daily - 7 x weekly - 2 sets - 30 sec hold Supine Bridge with Mini Swiss Ball Between Knees - 2 x daily - 7 x weekly - 2 sets - 10 reps Seated Hamstring Curl with Anchored Resistance - 2 x daily - 7 x weekly - 2 sets - 10 reps Standing Hip Abduction with Counter Support - 2 x daily - 7 x weekly - 2 sets - 10 reps

## 2021-05-17 NOTE — Therapy (Signed)
[facility] [place], [place], [address] Phone: [phone]   Fax:  [phone]  Physical Therapy Evaluation  Patient Details  Name: Tiffany Mccullough MRN: [id number] Date of Birth: Nov 12, 1970 Referring Provider (PT): Marlou Sa Tonna Corner, MD   Encounter Date: 05/17/2021   PT End of Session - 05/17/21 0915     Visit Number 1    Number of Visits 13    Date for PT Re-Evaluation 06/28/21    Authorization Type [employer]    PT Start Time 0839    PT Stop Time 0909    PT Time Calculation (min) 30 min    Activity Tolerance Patient tolerated treatment well    Behavior During Therapy [facility] for tasks assessed/performed             Past Medical History:  Diagnosis Date   Medical history non-contributory     Past Surgical History:  Procedure Laterality Date   DILATION AND CURETTAGE OF UTERUS     MASS EXCISION Bilateral 02/21/2014   Procedure: RIGHT INDEX EXCISION MASS/LEFT FINGER EXCISION MASS;  Surgeon: Tennis Must, MD;  Location: [facility];  Service: Orthopedics;  Laterality: Bilateral;   TUBAL LIGATION     tubal implants   WISDOM TOOTH EXTRACTION      There were no vitals filed for this visit.    Subjective Assessment - 05/17/21 0840     Subjective Patient reports L LBP and L hip pain since December 2019 when she fell at her child's school. Reached to grab the door handle, slipped and fell. Since onset, pain has improved. 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 Denies B&B changes. Worse with standing, walking, moving and lifting at work. Also notes pain when going up stairs and when pushing down on a pedal when riding a bike. Also has a problem with her L shoulder, making it difficult to find a comfortable position in bed. Better with heat.    Pertinent History none     Limitations Standing;Walking;House hold activities;Lifting    How long can you walk comfortably? .5-1 mile    Diagnostic tests 03/25/21 lumbar xray: No significant scoliosis noted.  Loss of lordosis is present.  Mild degenerative changes at L5-S1 with no evidence of spondylolisthesis.  No fracture.    Patient Stated Goals decrease pain    Currently in Pain? Yes    Pain Score 3     Pain Location Back    Pain Orientation Left    Pain Descriptors / Indicators Discomfort    Pain Type Chronic pain    Pain Radiating Towards "top of hip"                [facility] PT Assessment - 05/17/21 0846       Assessment   Medical Diagnosis Pain in left leg; Radicular syndrome of left leg    Referring Provider (PT) Meredith Pel, MD    Onset Date/Surgical Date 09/09/18    Next MD Visit not scheduled    Prior Therapy no      Precautions   Precautions None      Balance Screen   Has the patient fallen in the past 6 months No    Has the patient had a decrease in activity level because of a fear of falling?  No  Is the patient reluctant to leave their home because of a fear of falling?  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, lifting, transfers, pushing x-ray board under patients    Leisure working out      [employer]   Overall Cognitive Status Within Functional Limits for tasks assessed      Sensation   Light Touch Appears Intact      Coordination   Gross Motor Movements are Fluid and Coordinated Yes      Posture/Postural Control   Posture/Postural Control Postural  limitations    Postural Limitations Rounded Shoulders      ROM / Strength   AROM / PROM / Strength AROM;Strength      AROM   AROM Assessment Site Lumbar    Lumbar Flexion ankles   discomfort   Lumbar Extension WFL    Lumbar - Right Side Bend proximal tibia    Lumbar - Left Side Bend proximal tibia   discomfort   Lumbar - Right Rotation WFL    Lumbar - Left Rotation [facility]      Strength   Strength Assessment Site Hip;Knee;Ankle    Right/Left Hip Right;Left    Right Hip Flexion 4+/5    Right Hip ABduction 4/5    Right Hip ADduction 4/5    Left Hip Flexion 4+/5    Left Hip ABduction 4/5    Left Hip ADduction 4/5    Right/Left Knee Right;Left    Right Knee Flexion 4+/5    Right Knee Extension 4+/5    Left Knee Flexion 4-/5    Left Knee Extension 5/5    Right/Left Ankle Right;Left    Right Ankle Dorsiflexion 4+/5    Right Ankle Plantar Flexion 4+/5    Left Ankle Dorsiflexion 4+/5    Left Ankle Plantar Flexion 4+/5      Flexibility   Soft Tissue Assessment /Muscle Length yes    Quadriceps WFL    Piriformis flexibility WFL B; pain with L fig 4      Palpation   SI assessment  L PSIS slightly depressed and TTP; TTP in midline lower lumbar spine; increased tightness in L hip/buttock musculature      Ambulation/Gait   Assistive device None    Gait Pattern Step-through pattern;Decreased stance time - left;Decreased step length - right;Lateral trunk lean to left    Ambulation Surface Level;Indoor    Gait velocity WFL                        Objective measurements completed on examination: See above findings.               PT Education - 05/17/21 0914     Education Details prognosis, POC, HEP    Person(s) Educated Patient    Methods Explanation;Demonstration;Tactile cues;Verbal cues;Handout    Comprehension Verbalized understanding;Returned demonstration              PT Short Term Goals - 05/17/21 [id number]  PT SHORT TERM GOAL #1   Title  Patient to be independent with initial HEP.    Time 3    Period Weeks    Status New    Target Date 06/07/21               PT Long Term Goals - 05/17/21 0920       PT LONG TERM GOAL #1   Title Patient to be independent with advanced HEP.    Time 6    Period Weeks    Status New    Target Date 06/28/21      PT LONG TERM GOAL #2   Title Patient to demonstrate lumbar ROM WFL and without pain limiting.    Time 6    Period Weeks    Status New    Target Date 06/28/21      PT LONG TERM GOAL #3   Title Patient to demonstrate B LE strength >/=4+/5.    Time 6    Period Weeks    Status New    Target Date 06/28/21      PT LONG TERM GOAL #4   Title Patient to return to riding a bike pain-free.    Time 6    Period Weeks    Status New    Target Date 06/28/21      PT LONG TERM GOAL #5   Title Patient to report tolerance for ascending stairs with alternating reciprocal pattern without pain limiting.    Time 6    Period Weeks    Status New    Target Date 06/28/21                    Plan - 05/17/21 0915     Clinical Impression Statement Patient is a 50 y/o F presenting to OPPT with c/o L LBP and hip pain since a fall in December 2019. Notes intermittent N/T in L buttock to the knee. Denies B&B changes. Worse with standing, walking, moving and lifting at work, going up stairs, and when pushing down on a pedal when riding a bike. Patient is an Geologist, engineering at [employer], requiring prolonged standing, walking, lifting, and pushing. She would like to return to pain-free work activities and PLOF. Patient today presenting with rounded shoulders, slightly limited and painful lumbar AROM, limited B LE strength, pain with L piriformis stretching, slightly depressed and TTP L PSIS, TTP in midline lower lumbar spine, increased tightness in L hip/buttock musculature, and gait deviations. Patient was educated on gentle stretching and strengthening HEP and reported understanding. Would  benefit from skilled PT services 2x/week for 6 weeks to address aforementioned impairments.    Personal Factors and Comorbidities Age;Time since onset of injury/illness/exacerbation;Past/Current Experience    Examination-Activity Limitations Locomotion Level;Transfers;Bed Mobility;Bend;Sit;Caring for Others;Carry;Sleep;Squat;Stairs;Dressing;Hygiene/Grooming;Stand;Lift    Examination-Participation Restrictions Church;Meal Prep;Occupation;Cleaning;Community Activity;Driving;Shop;Laundry;Yard Work    Stability/Clinical Decision Making Stable/Uncomplicated    Clinical Decision Making Low    Rehab Potential Good    PT Frequency 2x / week    PT Duration 6 weeks    PT Treatment/Interventions ADLs/Self Care Home Management;Cryotherapy;Electrical Stimulation;Ultrasound;Traction;Moist Heat;Iontophoresis '4mg'$ /ml Dexamethasone;Gait training;Stair training;Functional mobility training;Therapeutic activities;Therapeutic exercise;Balance training;Neuromuscular re-education;Manual techniques;Passive range of motion;Dry needling;Energy conservation;Vasopneumatic Device;Taping    PT Next Visit Plan lumbar FOTO; reassess HEP, progress hip and core strength and pain-free lumbar/hip mobility    Consulted and Agree with Plan of Care Patient             Patient will benefit from skilled  therapeutic intervention in order to improve the following deficits and impairments:  Abnormal gait, Decreased range of motion, Difficulty walking, Increased fascial restricitons, Increased muscle spasms, Decreased activity tolerance, Pain, Impaired flexibility, Improper body mechanics, Postural dysfunction, Decreased strength  Visit Diagnosis: Chronic left-sided low back pain with left-sided sciatica  Pain in left hip  Muscle weakness (generalized)  Difficulty in walking, not elsewhere classified     Problem List There are no problems to display for this patient.   Janene Harvey, PT, DPT 05/17/21 9:23  AM    [facility]. [place], [place], [address] Phone: [phone]   Fax:  [phone]  Name: Skyi Gravell MRN: [id number] Date of Birth: [birth year]

## 2021-05-20 ENCOUNTER — Other Ambulatory Visit: Payer: Self-pay

## 2021-05-20 ENCOUNTER — Ambulatory Visit: Payer: Managed Care, Other (non HMO) | Admitting: Rehabilitative and Restorative Service Providers"

## 2021-05-20 ENCOUNTER — Encounter: Payer: Self-pay | Admitting: Rehabilitative and Restorative Service Providers"

## 2021-05-20 DIAGNOSIS — G8929 Other chronic pain: Secondary | ICD-10-CM

## 2021-05-20 DIAGNOSIS — M25552 Pain in left hip: Secondary | ICD-10-CM

## 2021-05-20 DIAGNOSIS — M5442 Lumbago with sciatica, left side: Secondary | ICD-10-CM | POA: Diagnosis not present

## 2021-05-20 DIAGNOSIS — R262 Difficulty in walking, not elsewhere classified: Secondary | ICD-10-CM

## 2021-05-20 DIAGNOSIS — M6281 Muscle weakness (generalized): Secondary | ICD-10-CM

## 2021-05-20 NOTE — Therapy (Signed)
[place]. [place], [place], [address] Phone: [phone]   Fax:  [phone]  Physical Therapy Treatment  Patient Details  Name: Tiffany Mccullough MRN: [id number] Date of Birth: 01-07-1971 Referring Provider (PT): Marlou Sa Tonna Corner, MD   Encounter Date: 05/20/2021   PT End of Session - 05/20/21 0816     Visit Number 2    Number of Visits 13    Date for PT Re-Evaluation 06/28/21    Authorization Type [employer]    PT Start Time 0755    PT Stop Time 0835    PT Time Calculation (min) 40 min    Activity Tolerance Patient tolerated treatment well    Behavior During Therapy [facility] for tasks assessed/performed             Past Medical History:  Diagnosis Date   Medical history non-contributory     Past Surgical History:  Procedure Laterality Date   DILATION AND CURETTAGE OF UTERUS     MASS EXCISION Bilateral 02/21/2014   Procedure: RIGHT INDEX EXCISION MASS/LEFT FINGER EXCISION MASS;  Surgeon: Tennis Must, MD;  Location: [facility];  Service: Orthopedics;  Laterality: Bilateral;   TUBAL LIGATION     tubal implants   WISDOM TOOTH EXTRACTION      There were no vitals filed for this visit.   Subjective Assessment - 05/20/21 0815     Subjective I am doing okay, just tired from working last night.    Patient Stated Goals decrease pain    Currently in Pain? Yes    Pain Score 3     Pain Location Back    Pain Orientation Left    Pain Descriptors / Indicators Radiating;Constant;Dull;Sharp    Pain Type Chronic pain                [facility] PT Assessment - 05/20/21 0001       Observation/Other Assessments   Focus on Therapeutic Outcomes (FOTO)  58%                           [facility] Adult PT Treatment/Exercise - 05/20/21 0001       Exercises   Exercises Lumbar      Lumbar Exercises: Stretches   Pelvic Tilt 10 reps    Pelvic Tilt Limitations on Dynadisc x10  fwd/back, x10 side/side    Prone on Elbows Stretch 1 rep    Prone on Elbows Stretch Limitations hold for 2 min      Lumbar Exercises: Aerobic   Recumbent Bike L2.0 x27mn      Lumbar Exercises: Machines for Strengthening   Other Lumbar Machine Exercise Cybex Row and lat pull 20# 2x10    Other Lumbar Machine Exercise Lumbar ext w/black tband 2x10      Lumbar Exercises: Supine   Ab Set 20 reps;5 seconds    Bridge Compliant;10 reps;2 seconds    Bridge with [employer];10 reps      Manual Therapy   Manual Therapy Soft tissue mobilization;Myofascial release    Manual therapy comments in sitting    Soft tissue mobilization to left lumbar paraspinals    Myofascial Release manual trigger point release to left lumbar paraspinals                      PT Short Term Goals - 05/20/21 0845       PT SHORT TERM  GOAL #1   Title Patient to be independent with initial HEP.    Time 3    Period Weeks    Status Partially Met               PT Long Term Goals - 05/17/21 0920       PT LONG TERM GOAL #1   Title Patient to be independent with advanced HEP.    Time 6    Period Weeks    Status New    Target Date 06/28/21      PT LONG TERM GOAL #2   Title Patient to demonstrate lumbar ROM WFL and without pain limiting.    Time 6    Period Weeks    Status New    Target Date 06/28/21      PT LONG TERM GOAL #3   Title Patient to demonstrate B LE strength >/=4+/5.    Time 6    Period Weeks    Status New    Target Date 06/28/21      PT LONG TERM GOAL #4   Title Patient to return to riding a bike pain-free.    Time 6    Period Weeks    Status New    Target Date 06/28/21      PT LONG TERM GOAL #5   Title Patient to report tolerance for ascending stairs with alternating reciprocal pattern without pain limiting.    Time 6    Period Weeks    Status New    Target Date 06/28/21                   Plan - 05/20/21 0837     Clinical Impression  Statement Pt with trigger point noted on L lumbar paraspinals with pain decreased following manual therapy and manual trigger point release.  Attempted prone hip extension, but pt with increased pain and unable to tolerate.  She tolerated other exercises well, with min cuing for postural cues.  She continues to require skilled PT to progress towards goal related activities.    PT Treatment/Interventions ADLs/Self Care Home Management;Cryotherapy;Electrical Stimulation;Ultrasound;Traction;Moist Heat;Iontophoresis 27m/ml Dexamethasone;Gait training;Stair training;Functional mobility training;Therapeutic activities;Therapeutic exercise;Balance training;Neuromuscular re-education;Manual techniques;Passive range of motion;Dry needling;Energy conservation;Vasopneumatic Device;Taping    PT Next Visit Plan DN if pt is agreeable/appropriate, progress hip and core strength and pain-free lumbar/hip mobility    Consulted and Agree with Plan of Care Patient             Patient will benefit from skilled therapeutic intervention in order to improve the following deficits and impairments:  Abnormal gait, Decreased range of motion, Difficulty walking, Increased fascial restricitons, Increased muscle spasms, Decreased activity tolerance, Pain, Impaired flexibility, Improper body mechanics, Postural dysfunction, Decreased strength  Visit Diagnosis: Chronic left-sided low back pain with left-sided sciatica  Pain in left hip  Muscle weakness (generalized)  Difficulty in walking, not elsewhere classified     Problem List There are no problems to display for this patient.   SJuel Burrow PT, DPT 05/20/2021, 9:33 AM  [place] [address]Phone: [phone]  Fax:  [phone] Name: Tiffany JasmerMRN: [id number] of Birth: 519-Nov-1972

## 2021-05-24 ENCOUNTER — Ambulatory Visit: Payer: Managed Care, Other (non HMO) | Admitting: Physical Therapy

## 2021-05-24 ENCOUNTER — Encounter: Payer: Self-pay | Admitting: Physical Therapy

## 2021-05-24 ENCOUNTER — Other Ambulatory Visit: Payer: Self-pay

## 2021-05-24 DIAGNOSIS — M25552 Pain in left hip: Secondary | ICD-10-CM

## 2021-05-24 DIAGNOSIS — M6281 Muscle weakness (generalized): Secondary | ICD-10-CM

## 2021-05-24 DIAGNOSIS — M5442 Lumbago with sciatica, left side: Secondary | ICD-10-CM | POA: Diagnosis not present

## 2021-05-24 DIAGNOSIS — R262 Difficulty in walking, not elsewhere classified: Secondary | ICD-10-CM

## 2021-05-24 DIAGNOSIS — G8929 Other chronic pain: Secondary | ICD-10-CM

## 2021-05-24 NOTE — Therapy (Signed)
[place]. [place], [place], [address] Phone: [phone]   Fax:  [phone]  Physical Therapy Treatment  Patient Details  Name: Tiffany Mccullough MRN: [id number] Date of Birth: [birth year] Referring Provider (PT): Marlou Sa Tonna Corner, MD   Encounter Date: 05/24/2021   PT End of Session - 05/24/21 0930     Visit Number 3    Number of Visits 13    Date for PT Re-Evaluation 06/28/21    Authorization Type [employer]    PT Start Time [phone]    PT Stop Time 0927    PT Time Calculation (min) 41 min    Activity Tolerance Patient tolerated treatment well;Patient limited by pain    Behavior During Therapy [facility] for tasks assessed/performed             Past Medical History:  Diagnosis Date   Medical history non-contributory     Past Surgical History:  Procedure Laterality Date   DILATION AND CURETTAGE OF UTERUS     MASS EXCISION Bilateral 02/21/2014   Procedure: RIGHT INDEX EXCISION MASS/LEFT FINGER EXCISION MASS;  Surgeon: Tennis Must, MD;  Location: [facility];  Service: Orthopedics;  Laterality: Bilateral;   TUBAL LIGATION     tubal implants   WISDOM TOOTH EXTRACTION      There were no vitals filed for this visit.   Subjective Assessment - 05/24/21 0847     Subjective Did not work last night- feeling relatively rested. Denies issues on HEP. Weather is bothering her shoulder.    Pertinent History none    Diagnostic tests 03/25/21 lumbar xray: No significant scoliosis noted.  Loss of lordosis is present.  Mild degenerative changes at L5-S1 with no evidence of spondylolisthesis.  No fracture.    Patient Stated Goals decrease pain    Currently in Pain? Yes    Pain Score 5     Pain Location Back    Pain Orientation Left    Pain Descriptors / Indicators Radiating    Pain Type Chronic pain    Multiple Pain Sites Yes    Pain Score 6    Pain Location Shoulder    Pain Orientation Left    Pain  Descriptors / Indicators Aching    Pain Type Surgical pain                [facility] PT Assessment - 05/24/21 0001       Observation/Other Assessments   Focus on Therapeutic Outcomes (FOTO)  lumbar: 45      AROM   AROM Assessment Site Hip    Right/Left Hip Right;Left    Right Hip Flexion 130    Right Hip External Rotation  27    Right Hip Internal Rotation  35    Left Hip Flexion 118    Left Hip External Rotation  24   pain over L iliac crest   Left Hip Internal Rotation  41                           [facility] Adult PT Treatment/Exercise - 05/24/21 0001       Lumbar Exercises: Stretches   Hip Flexor Stretch Left;2 reps;30 seconds    Hip Flexor Stretch Limitations mod thomas with R SKTC    Piriformis Stretch 1 rep;30 seconds;Right;Left    Piriformis Stretch Limitations supine KTOS    Figure 4 Stretch 1 rep;30 seconds;With overpressure  Figure 4 Stretch Limitations supine, R/L      Lumbar Exercises: Aerobic   Nustep L5 x 6 min UEs/LEs   noted LBP afterwards     Lumbar Exercises: Supine   Bridge with [employer] 10 reps    Bridge with clamshell 10 reps    Bridge with [employer] Limitations red TB    Other Supine Lumbar Exercises isometric hip flexion 2x 5x5" B, unable to tolerate alternate UE/LE d/t L SIJ pain      Lumbar Exercises: Prone   Other Prone Lumbar Exercises partial prone on hands 10x to tolerance      Manual Therapy   Manual Therapy Soft tissue mobilization;Myofascial release    Manual therapy comments prone    Soft tissue mobilization STM to L proximal glutes and piriformis    Myofascial Release TPR to L proximal glutes and piriformis   tender large trigger pt in piriformis                     PT Short Term Goals - 05/20/21 0845       PT SHORT TERM GOAL #1   Title Patient to be independent with initial HEP.    Time 3    Period Weeks    Status Partially Met               PT Long Term Goals - 05/24/21 0931        PT LONG TERM GOAL #1   Title Patient to be independent with advanced HEP.    Time 6    Period Weeks    Status On-going      PT LONG TERM GOAL #2   Title Patient to demonstrate lumbar ROM WFL and without pain limiting.    Time 6    Period Weeks    Status On-going      PT LONG TERM GOAL #3   Title Patient to demonstrate B LE strength >/=4+/5.    Time 6    Period Weeks    Status On-going      PT LONG TERM GOAL #4   Title Patient to return to riding a bike pain-free.    Time 6    Period Weeks    Status On-going      PT LONG TERM GOAL #5   Title Patient to report tolerance for ascending stairs with alternating reciprocal pattern without pain limiting.    Time 6    Period Weeks    Status On-going                   Plan - 05/24/21 0931     Clinical Impression Statement Patient arrived to session with report of mild/moderate pain in LB and L shoulder this AM d/t the weather. Denies issues on HEP. Hip AROM was assessed and revealed most limitation and discomfort with L hip ER. Educated patient on importance of compliance with gentle piriformis stretching. Patient reported understanding. Patient performed progressive core strengthening ther-ex with good tolerance of B LE exercises, however limited by L SIJ pain when performing alternating single LE ther-ex, especially when L hip contracts into flexion. Slight pain relief was provided with prone lumbar extension and hip extension stretching. Ended session with STM and TPR to L piriformis, which revealed large painful trigger point. Noted interest in DN in future sessions. Patient noted slightly increased pain at end of session, but declined modalities.    Examination-Activity Limitations Locomotion Level;Transfers;Bed Mobility;Bend;Sit;Caring for Others;Carry;Sleep;Squat;Stairs;Dressing;Hygiene/Grooming;Stand;Lift  PT Treatment/Interventions ADLs/Self Care Home Management;Cryotherapy;Electrical  Stimulation;Ultrasound;Traction;Moist Heat;Iontophoresis 41m/ml Dexamethasone;Gait training;Stair training;Functional mobility training;Therapeutic activities;Therapeutic exercise;Balance training;Neuromuscular re-education;Manual techniques;Passive range of motion;Dry needling;Energy conservation;Vasopneumatic Device;Taping    PT Next Visit Plan DN if pt is agreeable/appropriate, progress hip and core strength and pain-free lumbar/hip mobility    Consulted and Agree with Plan of Care Patient             Patient will benefit from skilled therapeutic intervention in order to improve the following deficits and impairments:  Abnormal gait, Decreased range of motion, Difficulty walking, Increased fascial restricitons, Increased muscle spasms, Decreased activity tolerance, Pain, Impaired flexibility, Improper body mechanics, Postural dysfunction, Decreased strength  Visit Diagnosis: Chronic left-sided low back pain with left-sided sciatica  Pain in left hip  Muscle weakness (generalized)  Difficulty in walking, not elsewhere classified     Problem List There are no problems to display for this patient.    YJanene Harvey PT, DPT 05/24/21 9:40 AM   [place] [address]Phone: [phone]  Fax:  [phone] Name: BOnesha KrebbsMRN: [id number] of Birth: [birth year]

## 2021-05-26 ENCOUNTER — Other Ambulatory Visit: Payer: Self-pay

## 2021-05-26 ENCOUNTER — Ambulatory Visit: Payer: Managed Care, Other (non HMO) | Admitting: Physical Therapy

## 2021-05-26 ENCOUNTER — Encounter: Payer: Self-pay | Admitting: Physical Therapy

## 2021-05-26 DIAGNOSIS — M5442 Lumbago with sciatica, left side: Secondary | ICD-10-CM | POA: Diagnosis not present

## 2021-05-26 DIAGNOSIS — R262 Difficulty in walking, not elsewhere classified: Secondary | ICD-10-CM

## 2021-05-26 DIAGNOSIS — M6281 Muscle weakness (generalized): Secondary | ICD-10-CM

## 2021-05-26 DIAGNOSIS — M25552 Pain in left hip: Secondary | ICD-10-CM

## 2021-05-26 DIAGNOSIS — G8929 Other chronic pain: Secondary | ICD-10-CM

## 2021-05-26 NOTE — Therapy (Signed)
[place]. [place], [place], [address] Phone: [phone]   Fax:  [phone]  Physical Therapy Treatment  Patient Details  Name: Tiffany Mccullough MRN: [id number] Date of Birth: [birth year] Referring Provider (PT): Marlou Sa Tonna Corner, MD   Encounter Date: 05/26/2021   PT End of Session - 05/26/21 0928     Visit Number 4    Number of Visits 13    Date for PT Re-Evaluation 06/28/21    Authorization Type [employer]    PT Start Time 0848    PT Stop Time 0928    PT Time Calculation (min) 40 min    Activity Tolerance Patient tolerated treatment well;Patient limited by pain    Behavior During Therapy [facility] for tasks assessed/performed             Past Medical History:  Diagnosis Date   Medical history non-contributory     Past Surgical History:  Procedure Laterality Date   DILATION AND CURETTAGE OF UTERUS     MASS EXCISION Bilateral 02/21/2014   Procedure: RIGHT INDEX EXCISION MASS/LEFT FINGER EXCISION MASS;  Surgeon: Tennis Must, MD;  Location: [facility];  Service: Orthopedics;  Laterality: Bilateral;   TUBAL LIGATION     tubal implants   WISDOM TOOTH EXTRACTION      There were no vitals filed for this visit.   Subjective Assessment - 05/26/21 0849     Subjective Has not slept well this week- not related to her back.    Pertinent History none    Diagnostic tests 03/25/21 lumbar xray: No significant scoliosis noted.  Loss of lordosis is present.  Mild degenerative changes at L5-S1 with no evidence of spondylolisthesis.  No fracture.    Patient Stated Goals decrease pain    Currently in Pain? Yes    Pain Score 3     Pain Location Hip    Pain Orientation Anterior;Left    Pain Descriptors / Indicators Radiating    Pain Type Chronic pain                [facility] PT Assessment - 05/26/21 0001       Palpation   SI assessment  L ASIS elevated, L PSIS elevated and TTP                            [facility] Adult PT Treatment/Exercise - 05/26/21 0001       Exercises   Exercises Knee/Hip      Lumbar Exercises: Stretches   ITB Stretch Left;2 reps;30 seconds    ITB Stretch Limitations supine with strap    Piriformis Stretch 30 seconds;Left;2 reps    Piriformis Stretch Limitations supine KTOS      Lumbar Exercises: Aerobic   Recumbent Bike L2.0 x50mn      Lumbar Exercises: Supine   Pelvic Tilt 10 reps    Pelvic Tilt Limitations c/o discomfort with posterior pelvic tilt    Other Supine Lumbar Exercises overhead yellow medball lift with neutral spine 10x, alt heel tap 10x      Lumbar Exercises: Sidelying   Hip Abduction Right;Left;10 reps    Hip Abduction Limitations manual cueing for alignment    Other Sidelying Lumbar Exercises R/L hip adduction 10x   discontinued d/t L LB/SIJ pain     Knee/Hip Exercises: Supine   Bridges Strengthening;Both;1 set;10 reps    Bridges Limitations straight leg bridge   heavy  HS compensation   Bridges with Clamshell Strengthening;Both;1 set;15 reps   red TB     Manual Therapy   Manual Therapy Joint mobilization    Manual therapy comments supine    Joint Mobilization L hip long axis distraction grade III/IV                    PT Education - 05/26/21 0926     Education Details edu on patient's extension preference and using repeated extension while at work    [employer]) Educated Patient    Methods Explanation;Demonstration;Tactile cues;Verbal cues;Handout    Comprehension Verbalized understanding;Returned demonstration              PT Short Term Goals - 05/26/21 0929       PT SHORT TERM GOAL #1   Title Patient to be independent with initial HEP.    Time 3    Period Weeks    Status Achieved               PT Long Term Goals - 05/24/21 0931       PT LONG TERM GOAL #1   Title Patient to be independent with advanced HEP.    Time 6    Period Weeks    Status On-going      PT LONG  TERM GOAL #2   Title Patient to demonstrate lumbar ROM WFL and without pain limiting.    Time 6    Period Weeks    Status On-going      PT LONG TERM GOAL #3   Title Patient to demonstrate B LE strength >/=4+/5.    Time 6    Period Weeks    Status On-going      PT LONG TERM GOAL #4   Title Patient to return to riding a bike pain-free.    Time 6    Period Weeks    Status On-going      PT LONG TERM GOAL #5   Title Patient to report tolerance for ascending stairs with alternating reciprocal pattern without pain limiting.    Time 6    Period Weeks    Status On-going                   Plan - 05/26/21 [id number]     Clinical Impression Statement Patient noting slightly decreased pain at start of session today. Re-assessed pelvic alignment, which revealed elevated L ASIS and PSIS, suggesting L innominate upslip. Proceeded with gentle L hip distraction which was well-tolerated. Patient noted pain over L anterolateral hip today; localized this to L glute med which was TTP, thus may be a potential area for DN next session. Hip adduction in sidelying was limited by c/o L LB/SIJ pain, however good tolerance with abduction activities. Progressed glute strengthening which revealed marked weakness and heavy compensation with HS's. Patient continues to report an extension preference with exercises and positioning. Patient reported mild increase in LBP to 5/10 at end of session, however declined modalities at end of session.    Examination-Activity Limitations Locomotion Level;Transfers;Bed Mobility;Bend;Sit;Caring for Others;Carry;Sleep;Squat;Stairs;Dressing;Hygiene/Grooming;Stand;Lift    PT Treatment/Interventions ADLs/Self Care Home Management;Cryotherapy;Electrical Stimulation;Ultrasound;Traction;Moist Heat;Iontophoresis '4mg'$ /ml Dexamethasone;Gait training;Stair training;Functional mobility training;Therapeutic activities;Therapeutic exercise;Balance training;Neuromuscular re-education;Manual  techniques;Passive range of motion;Dry needling;Energy conservation;Vasopneumatic Device;Taping    PT Next Visit Plan DN if pt is agreeable/appropriate, progress hip and core strength and pain-free lumbar/hip mobility    Consulted and Agree with Plan of Care Patient  Patient will benefit from skilled therapeutic intervention in order to improve the following deficits and impairments:  Abnormal gait, Decreased range of motion, Difficulty walking, Increased fascial restricitons, Increased muscle spasms, Decreased activity tolerance, Pain, Impaired flexibility, Improper body mechanics, Postural dysfunction, Decreased strength  Visit Diagnosis: Chronic left-sided low back pain with left-sided sciatica  Pain in left hip  Muscle weakness (generalized)  Difficulty in walking, not elsewhere classified     Problem List There are no problems to display for this patient.    Janene Harvey, PT, DPT 05/26/21 9:31 AM    [place]. [place], [place], [address] Phone: [phone]   Fax:  [phone]  Name: Koni Savin MRN: [id number] Date of Birth: 05-14-1971

## 2021-05-31 ENCOUNTER — Encounter: Payer: Self-pay | Admitting: Physical Therapy

## 2021-05-31 ENCOUNTER — Ambulatory Visit: Payer: Managed Care, Other (non HMO) | Admitting: Physical Therapy

## 2021-05-31 ENCOUNTER — Other Ambulatory Visit: Payer: Self-pay

## 2021-05-31 DIAGNOSIS — M5442 Lumbago with sciatica, left side: Secondary | ICD-10-CM

## 2021-05-31 DIAGNOSIS — R262 Difficulty in walking, not elsewhere classified: Secondary | ICD-10-CM

## 2021-05-31 DIAGNOSIS — G8929 Other chronic pain: Secondary | ICD-10-CM

## 2021-05-31 DIAGNOSIS — M6281 Muscle weakness (generalized): Secondary | ICD-10-CM

## 2021-05-31 DIAGNOSIS — M25552 Pain in left hip: Secondary | ICD-10-CM

## 2021-05-31 NOTE — Therapy (Signed)
[facility] [place]. [place], [place], [address] Phone: [phone]   Fax:  [phone]  Physical Therapy Treatment  Patient Details  Name: Tiffany Mccullough MRN: [id number] Date of Birth: [birth year] Referring Provider (PT): Marlou Sa Tonna Corner, MD   Encounter Date: 05/31/2021   PT End of Session - 05/31/21 0926     Visit Number 5    Number of Visits 13    Date for PT Re-Evaluation 06/28/21    Authorization Type [employer]    PT Start Time [phone]    PT Stop Time 0925    PT Time Calculation (min) 42 min    Activity Tolerance Patient tolerated treatment well;Patient limited by pain    Behavior During Therapy [facility] for tasks assessed/performed             Past Medical History:  Diagnosis Date   Medical history non-contributory     Past Surgical History:  Procedure Laterality Date   DILATION AND CURETTAGE OF UTERUS     MASS EXCISION Bilateral 02/21/2014   Procedure: RIGHT INDEX EXCISION MASS/LEFT FINGER EXCISION MASS;  Surgeon: Tennis Must, MD;  Location: [facility];  Service: Orthopedics;  Laterality: Bilateral;   TUBAL LIGATION     tubal implants   WISDOM TOOTH EXTRACTION      There were no vitals filed for this visit.   Subjective Assessment - 05/31/21 0844     Subjective "I hurt, I'm grouchy and I'm sleepy." Unsure what flared her up. Agreeable to try DN today.    Pertinent History none    Diagnostic tests 03/25/21 lumbar xray: No significant scoliosis noted.  Loss of lordosis is present.  Mild degenerative changes at L5-S1 with no evidence of spondylolisthesis.  No fracture.    Patient Stated Goals decrease pain    Currently in Pain? Yes    Pain Score 5     Pain Location Hip    Pain Orientation Left    Pain Descriptors / Indicators Radiating    Pain Type Chronic pain                               [facility] Adult PT Treatment/Exercise - 05/31/21 0001       Lumbar  Exercises: Aerobic   Recumbent Bike L2.0 x63mn      Lumbar Exercises: Prone   Other Prone Lumbar Exercises partial prone on hands 10x to tolerance      Lumbar Exercises: Quadruped   Madcat/Old Horse 10 reps    Madcat/Old Horse Limitations good ROM    Other Quadruped Lumbar Exercises R/L open book 5x each      Knee/Hip Exercises: Stretches   Piriformis Stretch Left;2 reps;30 seconds    Piriformis Stretch Limitations supine KTOS and fig 4 each      Knee/Hip Exercises: Seated   Hamstring Curl Strengthening;Left;2 sets;10 reps    Hamstring Limitations green TB   attempted in standing with red and yellow TB but limited by posterior knee pain     Manual Therapy   Manual Therapy Soft tissue mobilization;Myofascial release    Manual therapy comments prone    Soft tissue mobilization STM to L proximal glutes and piriformis, L lumbar paraspinals, QL    Myofascial Release TPR to L proximal glutes and piriformis, L lumbar paraspinals and QL   TTP throughout             Trigger  Point Dry Needling - 05/31/21 0001     Consent Given? Yes    Education Handout Provided Yes    Muscles Treated Back/Hip Piriformis;Erector spinae    Piriformis Response Twitch response elicited    Erector spinae Response Twitch response elicited                  PT Education - 05/31/21 0913     Education Details edu on post-DN instructions and activities    Person(s) Educated Patient    Methods Explanation;Demonstration;Tactile cues;Verbal cues;Handout    Comprehension Verbalized understanding              PT Short Term Goals - 05/26/21 0929       PT SHORT TERM GOAL #1   Title Patient to be independent with initial HEP.    Time 3    Period Weeks    Status Achieved               PT Long Term Goals - 05/24/21 0931       PT LONG TERM GOAL #1   Title Patient to be independent with advanced HEP.    Time 6    Period Weeks    Status On-going      PT LONG TERM GOAL #2   Title  Patient to demonstrate lumbar ROM WFL and without pain limiting.    Time 6    Period Weeks    Status On-going      PT LONG TERM GOAL #3   Title Patient to demonstrate B LE strength >/=4+/5.    Time 6    Period Weeks    Status On-going      PT LONG TERM GOAL #4   Title Patient to return to riding a bike pain-free.    Time 6    Period Weeks    Status On-going      PT LONG TERM GOAL #5   Title Patient to report tolerance for ascending stairs with alternating reciprocal pattern without pain limiting.    Time 6    Period Weeks    Status On-going                   Plan - 05/31/21 [id number]     Clinical Impression Statement Patient arrived to session with report of increased L hip pain without known cause. Agreeable to try DN today. Patient particularly tight and TTP over the L glute med, piriformis, lumbar paraspinals, and QL. After DN, patient tolerated STM and TPR to these areas for improved comfort and tissue extensibility. Gentle lumbopelvic ROM and hip stretching was performed without complaints. Patient did demonstrate decreased rotation ROM today with stretching. Attempted HS strengthening in standing which was limited by posterior knee pain; better tolerance in sitting. Patient was educated on post-DN soreness and encouraged to perform activity as usually. Patient reported understanding. Noted stiffness at end of session but pain unchanged.    Examination-Activity Limitations Locomotion Level;Transfers;Bed Mobility;Bend;Sit;Caring for Others;Carry;Sleep;Squat;Stairs;Dressing;Hygiene/Grooming;Stand;Lift    PT Treatment/Interventions ADLs/Self Care Home Management;Cryotherapy;Electrical Stimulation;Ultrasound;Traction;Moist Heat;Iontophoresis '4mg'$ /ml Dexamethasone;Gait training;Stair training;Functional mobility training;Therapeutic activities;Therapeutic exercise;Balance training;Neuromuscular re-education;Manual techniques;Passive range of motion;Dry needling;Energy  conservation;Vasopneumatic Device;Taping    PT Next Visit Plan DN if pt is agreeable/appropriate, progress hip and core strength and pain-free lumbar/hip mobility    Consulted and Agree with Plan of Care Patient             Patient will benefit from skilled therapeutic intervention in order to improve the following deficits and  impairments:  Abnormal gait, Decreased range of motion, Difficulty walking, Increased fascial restricitons, Increased muscle spasms, Decreased activity tolerance, Pain, Impaired flexibility, Improper body mechanics, Postural dysfunction, Decreased strength  Visit Diagnosis: Chronic left-sided low back pain with left-sided sciatica  Pain in left hip  Muscle weakness (generalized)  Difficulty in walking, not elsewhere classified     Problem List There are no problems to display for this patient.   Janene Harvey, PT, DPT 05/31/21 9:29 AM   [facility]. [place], [place], [address] Phone: [phone]   Fax:  [phone]  Name: Tiffany Mccullough MRN: [id number] Date of Birth: 11/22/1970

## 2021-05-31 NOTE — Patient Instructions (Signed)

## 2021-06-02 ENCOUNTER — Other Ambulatory Visit: Payer: Self-pay

## 2021-06-02 ENCOUNTER — Telehealth: Payer: Self-pay | Admitting: Orthopedic Surgery

## 2021-06-02 ENCOUNTER — Encounter: Payer: Self-pay | Admitting: Physical Therapy

## 2021-06-02 ENCOUNTER — Ambulatory Visit: Payer: Managed Care, Other (non HMO) | Admitting: Physical Therapy

## 2021-06-02 DIAGNOSIS — M25552 Pain in left hip: Secondary | ICD-10-CM

## 2021-06-02 DIAGNOSIS — G8929 Other chronic pain: Secondary | ICD-10-CM

## 2021-06-02 DIAGNOSIS — R262 Difficulty in walking, not elsewhere classified: Secondary | ICD-10-CM

## 2021-06-02 DIAGNOSIS — M5442 Lumbago with sciatica, left side: Secondary | ICD-10-CM | POA: Diagnosis not present

## 2021-06-02 DIAGNOSIS — M6281 Muscle weakness (generalized): Secondary | ICD-10-CM

## 2021-06-02 NOTE — Therapy (Signed)
[facility]. [place], [place], [address] Phone: [phone]   Fax:  [phone]  Physical Therapy Treatment  Patient Details  Name: Tiffany Mccullough MRN: [id number] Date of Birth: January 16, 1971 Referring Provider (PT): Marlou Sa Tonna Corner, MD   Encounter Date: 06/02/2021   PT End of Session - 06/02/21 0923     Visit Number 6    Number of Visits 13    Date for PT Re-Evaluation 06/28/21    Authorization Type [employer]    PT Start Time 0845    PT Stop Time 0931    PT Time Calculation (min) 46 min    Activity Tolerance Patient tolerated treatment well;Patient limited by pain    Behavior During Therapy [facility] for tasks assessed/performed             Past Medical History:  Diagnosis Date   Medical history non-contributory     Past Surgical History:  Procedure Laterality Date   DILATION AND CURETTAGE OF UTERUS     MASS EXCISION Bilateral 02/21/2014   Procedure: RIGHT INDEX EXCISION MASS/LEFT FINGER EXCISION MASS;  Surgeon: Tennis Must, MD;  Location: [facility];  Service: Orthopedics;  Laterality: Bilateral;   TUBAL LIGATION     tubal implants   WISDOM TOOTH EXTRACTION      There were no vitals filed for this visit.   Subjective Assessment - 06/02/21 0847     Subjective Feeling a little worse since last session. Has a bad spot in her LB that radiates. Can't tell if DN gave her benefit.    Pertinent History none    Diagnostic tests 03/25/21 lumbar xray: No significant scoliosis noted.  Loss of lordosis is present.  Mild degenerative changes at L5-S1 with no evidence of spondylolisthesis.  No fracture.    Patient Stated Goals decrease pain    Currently in Pain? Yes    Pain Score 6     Pain Location Back    Pain Orientation Left    Pain Descriptors / Indicators Radiating    Pain Type Chronic pain                               [facility] Adult PT Treatment/Exercise - 06/02/21  0001       Lumbar Exercises: Stretches   Lower Trunk Rotation Limitations 10x cues to avoid pushing into pain      Lumbar Exercises: Aerobic   Recumbent Bike L1.5 x106mn      Lumbar Exercises: Seated   Sit to Stand 10 reps    Sit to Stand Limitations 10# at chest   cues for slow eccentric lower     Lumbar Exercises: Supine   Bridge with [facility]15 reps    Bridge with clamshell 15 reps    Bridge with BCardinal HealthLimitations red TB   unable to tolerate with green TB d/t pain     Lumbar Exercises: Sidelying   Other Sidelying Lumbar Exercises R/L open book 10x   cues to avoid pushing into pain; report of relief to R     Lumbar Exercises: Quadruped   Madcat/Old Horse 10 reps    Madcat/Old Horse Limitations good ROM    Other Quadruped Lumbar Exercises child's pose 5x10"      Knee/Hip Exercises: Stretches   Piriformis Stretch Left;2 reps;30 seconds    Piriformis Stretch Limitations supine KTOS and fig 2 each  Modalities   Modalities Electrical Stimulation;Moist Heat      Moist Heat Therapy   Number Minutes Moist Heat 10 Minutes    Moist Heat Location Lumbar Spine      Electrical Stimulation   Electrical Stimulation Location B lumbar paraspinals    Electrical Stimulation Action IFC    Electrical Stimulation Parameters 80-'150hz'$ ; out put to tolerance 10 min    Electrical Stimulation Goals Pain                      PT Short Term Goals - 05/26/21 0929       PT SHORT TERM GOAL #1   Title Patient to be independent with initial HEP.    Time 3    Period Weeks    Status Achieved               PT Long Term Goals - 05/24/21 0931       PT LONG TERM GOAL #1   Title Patient to be independent with advanced HEP.    Time 6    Period Weeks    Status On-going      PT LONG TERM GOAL #2   Title Patient to demonstrate lumbar ROM WFL and without pain limiting.    Time 6    Period Weeks    Status On-going      PT LONG TERM GOAL #3   Title Patient to  demonstrate B LE strength >/=4+/5.    Time 6    Period Weeks    Status On-going      PT LONG TERM GOAL #4   Title Patient to return to riding a bike pain-free.    Time 6    Period Weeks    Status On-going      PT LONG TERM GOAL #5   Title Patient to report tolerance for ascending stairs with alternating reciprocal pattern without pain limiting.    Time 6    Period Weeks    Status On-going                   Plan - 06/02/21 [id number]     Clinical Impression Statement Patient reporting increase in L LBP today. Unsure if she received benefit from DN. Worked on gentle hip and lumbopelvic ROM/stretching at start of session. Cueing provided intermittently to avoid pushing into pain and moving within a tolerable ROM. Patient noted "good stretch" with R trunk rotation stretch. Gentle bilateral LE strengthening was performed to avoid torsion on SIJ. Attempted to increase resistance at hips with bridges, which was limited by pain. Better tolerance with lighter resistance. Ended session with e-stim and moist heat to LB to address pain. Patient reported no change in pain levels at end of session.    Examination-Activity Limitations Locomotion Level;Transfers;Bed Mobility;Bend;Sit;Caring for Others;Carry;Sleep;Squat;Stairs;Dressing;Hygiene/Grooming;Stand;Lift    PT Treatment/Interventions ADLs/Self Care Home Management;Cryotherapy;Electrical Stimulation;Ultrasound;Traction;Moist Heat;Iontophoresis '4mg'$ /ml Dexamethasone;Gait training;Stair training;Functional mobility training;Therapeutic activities;Therapeutic exercise;Balance training;Neuromuscular re-education;Manual techniques;Passive range of motion;Dry needling;Energy conservation;Vasopneumatic Device;Taping    PT Next Visit Plan DN if pt is agreeable/appropriate, progress hip and core strength and pain-free lumbar/hip mobility    Consulted and Agree with Plan of Care Patient             Patient will benefit from skilled therapeutic  intervention in order to improve the following deficits and impairments:  Abnormal gait, Decreased range of motion, Difficulty walking, Increased fascial restricitons, Increased muscle spasms, Decreased activity tolerance, Pain, Impaired flexibility, Improper body mechanics, Postural dysfunction,  Decreased strength  Visit Diagnosis: Chronic left-sided low back pain with left-sided sciatica  Pain in left hip  Muscle weakness (generalized)  Difficulty in walking, not elsewhere classified     Problem List There are no problems to display for this patient.    Janene Harvey, PT, DPT 06/02/21 10:17 AM    [facility] [place]. [place], [place], [address] Phone: [phone]   Fax:  [phone]  Name: Chanteria Loera MRN: [id number] Date of Birth: 08/22/1971

## 2021-06-02 NOTE — Telephone Encounter (Signed)
Called patient left message on voicemail to return call to schedule an appointment with Dr Marlou Sa per her mychart message request

## 2021-06-07 ENCOUNTER — Other Ambulatory Visit: Payer: Self-pay

## 2021-06-07 ENCOUNTER — Ambulatory Visit: Payer: Managed Care, Other (non HMO) | Admitting: Physical Therapy

## 2021-06-07 ENCOUNTER — Encounter: Payer: Self-pay | Admitting: Physical Therapy

## 2021-06-07 DIAGNOSIS — M5442 Lumbago with sciatica, left side: Secondary | ICD-10-CM | POA: Diagnosis not present

## 2021-06-07 DIAGNOSIS — R262 Difficulty in walking, not elsewhere classified: Secondary | ICD-10-CM

## 2021-06-07 DIAGNOSIS — M6281 Muscle weakness (generalized): Secondary | ICD-10-CM

## 2021-06-07 DIAGNOSIS — M25552 Pain in left hip: Secondary | ICD-10-CM

## 2021-06-07 DIAGNOSIS — G8929 Other chronic pain: Secondary | ICD-10-CM

## 2021-06-07 NOTE — Therapy (Signed)
[facility]. [place], [place], [address] Phone: [phone]   Fax:  [phone]  Physical Therapy Treatment  Patient Details  Name: Tiffany Mccullough MRN: [id number] Date of Birth: 07/08/1971 Referring Provider (PT): Marlou Sa Tonna Corner, MD   Encounter Date: 06/07/2021   PT End of Session - 06/07/21 0924     Visit Number 7    Number of Visits 13    Date for PT Re-Evaluation 06/28/21    Authorization Type [employer]    PT Start Time [id number]    PT Stop Time 0927    PT Time Calculation (min) 43 min    Activity Tolerance Patient tolerated treatment well    Behavior During Therapy [facility] for tasks assessed/performed             Past Medical History:  Diagnosis Date   Medical history non-contributory     Past Surgical History:  Procedure Laterality Date   DILATION AND CURETTAGE OF UTERUS     MASS EXCISION Bilateral 02/21/2014   Procedure: RIGHT INDEX EXCISION MASS/LEFT FINGER EXCISION MASS;  Surgeon: Tennis Must, MD;  Location: [facility];  Service: Orthopedics;  Laterality: Bilateral;   TUBAL LIGATION     tubal implants   WISDOM TOOTH EXTRACTION      There were no vitals filed for this visit.   Subjective Assessment - 06/07/21 0845     Subjective Would like to drop down in frequency of PT as she would like to F/U with her MD.    Pertinent History none    Diagnostic tests 03/25/21 lumbar xray: No significant scoliosis noted.  Loss of lordosis is present.  Mild degenerative changes at L5-S1 with no evidence of spondylolisthesis.  No fracture.    Patient Stated Goals decrease pain    Currently in Pain? Yes    Pain Score 3     Pain Location Back    Pain Orientation Left    Pain Descriptors / Indicators Radiating    Pain Type Chronic pain                               [facility] Adult PT Treatment/Exercise - 06/07/21 0001       Lumbar Exercises: Aerobic   Recumbent Bike  L1.5 x90mn      Lumbar Exercises: Standing   Row Strengthening;Both;10 reps;Theraband    Theraband Level (Row) Level 3 (Green)    Row Limitations cues for form; 2x10    Shoulder Extension Strengthening;10 reps;Theraband    Theraband Level (Shoulder Extension) Level 2 (Red)    Shoulder Extension Limitations cues to maintain core tight; 2x10      Lumbar Exercises: Supine   Bridge with clamshell 15 reps    Bridge with BCardinal HealthLimitations red TB    Other Supine Lumbar Exercises LTR 10x to tolerance      Knee/Hip Exercises: Stretches   Piriformis Stretch Left;2 reps;30 seconds;Right    Piriformis Stretch Limitations supine KTOS and fig 2 each      Knee/Hip Exercises: Standing   Knee Flexion Strengthening;Right;Left    Knee Flexion Limitations R/L HS curl 3#   cues for core contraction; c/o pain in L distal HS   Hip Abduction Stengthening;Right;Left;1 set;10 reps;Knee straight    Abduction Limitations 2#    Hip Extension Stengthening;Right;Left;1 set;10 reps;Knee straight   cues to avoid anterior trunk lean   Extension Limitations 2#  Forward Step Up Left;1 set;Hand Hold: 1;5 reps;Step Height: 6"    Forward Step Up Limitations slow step back   cues to maintain chest up to avoid excessive UE use   Step Down Left;1 set;10 reps;Hand Hold: 2;Step Height: 6"    Step Down Limitations lateral step down slow   good control   Functional Squat 1 set;10 reps    Functional Squat Limitations squat to tolerance; red TB   c/o discomfort in L buttock   Other Standing Knee Exercises sidestepping with red TB above knees [id number]   cues for form     Knee/Hip Exercises: Supine   Other Supine Knee/Hip Exercises resisted hooklying march with red TB above knees 10x                      PT Short Term Goals - 05/26/21 0929       PT SHORT TERM GOAL #1   Title Patient to be independent with initial HEP.    Time 3    Period Weeks    Status Achieved               PT Long Term  Goals - 05/24/21 0931       PT LONG TERM GOAL #1   Title Patient to be independent with advanced HEP.    Time 6    Period Weeks    Status On-going      PT LONG TERM GOAL #2   Title Patient to demonstrate lumbar ROM WFL and without pain limiting.    Time 6    Period Weeks    Status On-going      PT LONG TERM GOAL #3   Title Patient to demonstrate B LE strength >/=4+/5.    Time 6    Period Weeks    Status On-going      PT LONG TERM GOAL #4   Title Patient to return to riding a bike pain-free.    Time 6    Period Weeks    Status On-going      PT LONG TERM GOAL #5   Title Patient to report tolerance for ascending stairs with alternating reciprocal pattern without pain limiting.    Time 6    Period Weeks    Status On-going                   Plan - 06/07/21 [id number]    Clinical Impression Statement Patient arrived to session with report of mild LBP. Noting that she would like to decrease her PT frequency and has a f/u with her MD on 06/23/21. Patient performed standing LE strengthening ther-ex. Patient noted some pain in the L lateral distal HS with HS curls. Able to demonstrate good squat form today. Step downs required cues to maintain chest up to avoid excessive UE use. Better success with lateral step downs vs. step backs. Standing ther-ex was fairly well-tolerated today. Patient noted more muscle fatigue rather than LBP throughout ther-ex. Patient with report of "I'm feeling better than usual" at end of session.    Examination-Activity Limitations Locomotion Level;Transfers;Bed Mobility;Bend;Sit;Caring for Others;Carry;Sleep;Squat;Stairs;Dressing;Hygiene/Grooming;Stand;Lift    PT Treatment/Interventions ADLs/Self Care Home Management;Cryotherapy;Electrical Stimulation;Ultrasound;Traction;Moist Heat;Iontophoresis '4mg'$ /ml Dexamethasone;Gait training;Stair training;Functional mobility training;Therapeutic activities;Therapeutic exercise;Balance training;Neuromuscular  re-education;Manual techniques;Passive range of motion;Dry needling;Energy conservation;Vasopneumatic Device;Taping    PT Next Visit Plan DN if pt is agreeable/appropriate, progress hip and core strength and pain-free lumbar/hip mobility    Consulted and Agree with Plan of Care Patient  Patient will benefit from skilled therapeutic intervention in order to improve the following deficits and impairments:  Abnormal gait, Decreased range of motion, Difficulty walking, Increased fascial restricitons, Increased muscle spasms, Decreased activity tolerance, Pain, Impaired flexibility, Improper body mechanics, Postural dysfunction, Decreased strength  Visit Diagnosis: Chronic left-sided low back pain with left-sided sciatica  Pain in left hip  Muscle weakness (generalized)  Difficulty in walking, not elsewhere classified     Problem List There are no problems to display for this patient.    Janene Harvey, PT, DPT 06/07/21 9:29 AM    [place]. [place], [place], [address] Phone: [phone]   Fax:  [phone]  Name: Tiffany Mccullough MRN: [id number] Date of Birth: [birth year]

## 2021-06-09 ENCOUNTER — Other Ambulatory Visit: Payer: Self-pay

## 2021-06-09 ENCOUNTER — Ambulatory Visit: Payer: Managed Care, Other (non HMO) | Admitting: Rehabilitative and Restorative Service Providers"

## 2021-06-09 ENCOUNTER — Encounter: Payer: Self-pay | Admitting: Rehabilitative and Restorative Service Providers"

## 2021-06-09 DIAGNOSIS — G8929 Other chronic pain: Secondary | ICD-10-CM

## 2021-06-09 DIAGNOSIS — M5442 Lumbago with sciatica, left side: Secondary | ICD-10-CM | POA: Diagnosis not present

## 2021-06-09 DIAGNOSIS — M25552 Pain in left hip: Secondary | ICD-10-CM

## 2021-06-09 DIAGNOSIS — R262 Difficulty in walking, not elsewhere classified: Secondary | ICD-10-CM

## 2021-06-09 DIAGNOSIS — M6281 Muscle weakness (generalized): Secondary | ICD-10-CM

## 2021-06-09 NOTE — Therapy (Signed)
[facility] [place]. [place], [place], [address] Phone: [phone]   Fax:  [phone]  Physical Therapy Treatment  Patient Details  Name: Tiffany Mccullough MRN: [id number] Date of Birth: 16-Dec-1970 Referring Provider (PT): Marlou Sa Tonna Corner, MD   Encounter Date: 06/09/2021   PT End of Session - 06/09/21 0850     Visit Number 8    Number of Visits 13    Date for PT Re-Evaluation 06/28/21    Authorization Type [employer]    PT Start Time 0845    PT Stop Time 0925    PT Time Calculation (min) 40 min    Activity Tolerance Patient tolerated treatment well    Behavior During Therapy [facility] for tasks assessed/performed             Past Medical History:  Diagnosis Date   Medical history non-contributory     Past Surgical History:  Procedure Laterality Date   DILATION AND CURETTAGE OF UTERUS     MASS EXCISION Bilateral 02/21/2014   Procedure: RIGHT INDEX EXCISION MASS/LEFT FINGER EXCISION MASS;  Surgeon: Tennis Must, MD;  Location: [facility];  Service: Orthopedics;  Laterality: Bilateral;   TUBAL LIGATION     tubal implants   WISDOM TOOTH EXTRACTION      There were no vitals filed for this visit.   Subjective Assessment - 06/09/21 0847     Subjective Last night was good, but the night before was bad.  I had to move more heavy patients.    Patient Stated Goals decrease pain    Currently in Pain? Yes    Pain Score 3     Pain Location Back    Pain Orientation Left;Lower    Pain Descriptors / Indicators Radiating;Shooting    Pain Radiating Towards into L buttocks/hip region.    Pain Score 4    Pain Location Shoulder    Pain Orientation Left                               [facility] Adult PT Treatment/Exercise - 06/09/21 0001       Lumbar Exercises: Stretches   Prone on Elbows Stretch 2 reps;20 seconds    Press Ups 10 reps    Other Lumbar Stretch Exercise PBall push out 3  way, 5 x 20 sec hold      Lumbar Exercises: Aerobic   Recumbent Bike L2.0 x6 min      Lumbar Exercises: Standing   Row Strengthening;Both;20 reps;Theraband    Theraband Level (Row) Level 3 (Green)    Row Limitations cues for form 2x10    Shoulder Extension Strengthening;10 reps;Theraband    Theraband Level (Shoulder Extension) Level 2 (Red)    Shoulder Extension Limitations cues to maintain core stability.  2x10      Lumbar Exercises: Prone   Straight Leg Raise 10 reps    Opposite Arm/Leg Raise Right arm/Left leg;Left arm/Right leg;10 reps      Knee/Hip Exercises: Standing   Knee Flexion Strengthening;Both;1 set;10 reps    Knee Flexion Limitations HS Curl 3#    Hip Flexion Stengthening;Both;1 set;10 reps    Hip Flexion Limitations 3#    Hip Abduction Stengthening;Both;1 set;10 reps    Abduction Limitations 3#    Hip Extension Stengthening;Both;1 set;10 reps    Extension Limitations 3#    Lateral Step Up Both;1 set;10 reps;Hand Hold: 0;Step Height: 6"  Forward Step Up Both;1 set;10 reps;Hand Hold: 0;Step Height: 6"    Step Down Both;1 set;10 reps;Hand Hold: 0;Step Height: 6"    Wall Squat 2 sets;10 reps    Wall Squat Limitations with theraball                      PT Short Term Goals - 05/26/21 0929       PT SHORT TERM GOAL #1   Title Patient to be independent with initial HEP.    Time 3    Period Weeks    Status Achieved               PT Long Term Goals - 06/09/21 7322       PT LONG TERM GOAL #1   Title Patient to be independent with advanced HEP.    Status Partially Met      PT LONG TERM GOAL #2   Title Patient to demonstrate lumbar ROM WFL and without pain limiting.    Status Partially Met      PT LONG TERM GOAL #3   Title Patient to demonstrate B LE strength >/=4+/5.    Status On-going      PT LONG TERM GOAL #4   Title Patient to return to riding a bike pain-free.    Status On-going      PT LONG TERM GOAL #5   Title Patient to  report tolerance for ascending stairs with alternating reciprocal pattern without pain limiting.    Status On-going                   Plan - 06/09/21 0926     Clinical Impression Statement Tiffany Mccullough overall tolerated session and ther ex well.  She states that towards completion of wall slides, she had incresed pain, but dissipated quickly.  She is improving with technique and maintaining core stability, requiring less cuing.  Extension exercises in prone added to help with decreasing radiating pain down LLE.  Pt reports relief of pain with prone on elbows stretch and prone press ups.  She continues to require skilled PT to progress towards goal related activities.    PT Treatment/Interventions ADLs/Self Care Home Management;Cryotherapy;Electrical Stimulation;Ultrasound;Traction;Moist Heat;Iontophoresis 61m/ml Dexamethasone;Gait training;Stair training;Functional mobility training;Therapeutic activities;Therapeutic exercise;Balance training;Neuromuscular re-education;Manual techniques;Passive range of motion;Dry needling;Energy conservation;Vasopneumatic Device;Taping    PT Next Visit Plan DN if pt is agreeable/appropriate, progress hip and core strength and pain-free lumbar/hip mobility    Consulted and Agree with Plan of Care Patient             Patient will benefit from skilled therapeutic intervention in order to improve the following deficits and impairments:  Abnormal gait, Decreased range of motion, Difficulty walking, Increased fascial restricitons, Increased muscle spasms, Decreased activity tolerance, Pain, Impaired flexibility, Improper body mechanics, Postural dysfunction, Decreased strength  Visit Diagnosis: Chronic left-sided low back pain with left-sided sciatica  Pain in left hip  Muscle weakness (generalized)  Difficulty in walking, not elsewhere classified     Problem List There are no problems to display for this patient.   SJuel Burrow PT,  DPT 06/09/2021, 9:29 AM  [place] [id number]: [phone]  Fax:  [phone] Name: BNevaeh KorteMRN: [id number] of Birth: 5Mar 25, 1972

## 2021-06-15 ENCOUNTER — Ambulatory Visit: Payer: Managed Care, Other (non HMO) | Admitting: Physical Therapy

## 2021-06-17 ENCOUNTER — Ambulatory Visit: Payer: Managed Care, Other (non HMO) | Admitting: Rehabilitative and Restorative Service Providers"

## 2021-06-23 ENCOUNTER — Ambulatory Visit
Payer: Managed Care, Other (non HMO) | Attending: Orthopedic Surgery | Admitting: Rehabilitative and Restorative Service Providers"

## 2021-06-23 ENCOUNTER — Encounter: Payer: Self-pay | Admitting: Orthopedic Surgery

## 2021-06-23 ENCOUNTER — Ambulatory Visit: Payer: Managed Care, Other (non HMO) | Admitting: Orthopedic Surgery

## 2021-06-23 ENCOUNTER — Other Ambulatory Visit: Payer: Self-pay

## 2021-06-23 ENCOUNTER — Encounter: Payer: Self-pay | Admitting: Rehabilitative and Restorative Service Providers"

## 2021-06-23 DIAGNOSIS — R262 Difficulty in walking, not elsewhere classified: Secondary | ICD-10-CM | POA: Diagnosis present

## 2021-06-23 DIAGNOSIS — M5442 Lumbago with sciatica, left side: Secondary | ICD-10-CM | POA: Insufficient documentation

## 2021-06-23 DIAGNOSIS — M25552 Pain in left hip: Secondary | ICD-10-CM

## 2021-06-23 DIAGNOSIS — M6281 Muscle weakness (generalized): Secondary | ICD-10-CM

## 2021-06-23 DIAGNOSIS — G8929 Other chronic pain: Secondary | ICD-10-CM

## 2021-06-23 DIAGNOSIS — M79605 Pain in left leg: Secondary | ICD-10-CM

## 2021-06-23 NOTE — Therapy (Signed)
[facility]. [place], [place], [address] Phone: [phone]   Fax:  [phone]  Physical Therapy Treatment  Patient Details  Name: Tiffany Mccullough MRN: [id number] Date of Birth: Feb 08, 1971 Referring Provider (PT): Marlou Sa Tonna Corner, MD   Encounter Date: 06/23/2021  Progress Note Reporting Period 05/17/2021 to 06/23/2021  See note below for Objective Data and Assessment of Progress/Goals.       PT End of Session - 06/23/21 0908     Visit Number 9    Number of Visits 13    Date for PT Re-Evaluation 07/23/21    Authorization Type [employer]    PT Start Time [id number]    PT Stop Time 0921    PT Time Calculation (min) 40 min    Activity Tolerance Patient tolerated treatment well    Behavior During Therapy [facility] for tasks assessed/performed             Past Medical History:  Diagnosis Date   Medical history non-contributory     Past Surgical History:  Procedure Laterality Date   DILATION AND CURETTAGE OF UTERUS     MASS EXCISION Bilateral 02/21/2014   Procedure: RIGHT INDEX EXCISION MASS/LEFT FINGER EXCISION MASS;  Surgeon: Tennis Must, MD;  Location: [facility];  Service: Orthopedics;  Laterality: Bilateral;   TUBAL LIGATION     tubal implants   WISDOM TOOTH EXTRACTION      There were no vitals filed for this visit.   Subjective Assessment - 06/23/21 0847     Subjective I am alright today.  I didn't have very many big big people today.    Patient Stated Goals decrease pain    Currently in Pain? Yes    Pain Score 3     Pain Location Back    Pain Orientation Left;Lower    Pain Score 3    Pain Location Shoulder    Pain Orientation Left                [facility] PT Assessment - 06/23/21 0001       Assessment   Medical Diagnosis Pain in left leg; Radicular syndrome of left leg    Referring Provider (PT) Meredith Pel, MD    Onset Date/Surgical Date 09/09/18    Hand  Dominance Right    Next MD Visit 06/23/2021      Precautions   Precautions None      Balance Screen   Has the patient fallen in the past 6 months No      [place] residence    Living Arrangements Spouse/significant other;Children    Available Help at Discharge Family    Type of [place] to enter    Entrance Stairs-Number of Steps [place] None      Prior Function   Level of Independence Independent    Vocation Full time employment    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  Hip: 59%  [place] Adult PT Treatment/Exercise - 06/23/21 0001       Lumbar Exercises: Stretches   Prone on Elbows Stretch 2 reps;60 seconds    Press Ups 10 reps;5 seconds      Lumbar Exercises: Aerobic   Recumbent Bike L2.0 x6 min      Lumbar Exercises: Machines for Strengthening   Cybex Knee Extension 15# 2x10    Cybex Knee Flexion 25# 2x10    Other Lumbar Machine Exercise 15# rows and lats 2x10   cuing to keep shoulders down and not shrug.     Lumbar Exercises: Standing   Shoulder Extension Strengthening;Power Tower;Both;20 reps    Shoulder Extension Limitations 10#.  cues to maintain tight core and not shrug shoulders.      Lumbar Exercises: Prone   Opposite Arm/Leg Raise Right arm/Left leg;Left arm/Right leg;10 reps   2x10     Lumbar Exercises: Quadruped   [place]cat/Old Horse 10 reps    Madcat/Old Horse Limitations good ROM    Opposite Arm/Leg Raise Right arm/Left leg;Left arm/Right leg;10 reps    Other Quadruped Lumbar Exercises child's pose 3x10"                        PT Short Term Goals - 05/26/21 0929       PT SHORT TERM GOAL #1   Title Patient to be independent with initial HEP.    Time 3    Period Weeks    Status Achieved               PT Long Term Goals - 06/23/21 1123       PT LONG TERM GOAL #1   Title Patient to be independent with advanced HEP.    Status Partially Met      PT LONG TERM GOAL #2   Title Patient to demonstrate lumbar ROM WFL and without pain limiting.    Status Partially Met      PT LONG TERM GOAL #3   Title Patient to demonstrate B LE strength >/=4+/5.    Status On-going      PT LONG TERM GOAL #4   Title Patient to return to riding a bike pain-free.    Status On-going      PT LONG TERM GOAL #5   Title Patient to report tolerance for ascending stairs with alternating reciprocal pattern without pain limiting.    Status On-going                   Plan - 06/23/21 1117     Clinical Impression Statement Pt continues to progress with ther ex and goal related activities.  She is progressing with HEP and performing independently at home.  She continues to progress with increased strength and has improved on her FOTO scores.  She does report relief and centralization of pain with prone on elbows.  She has not yet met her goal related activities and would benfeit from continued skilled PT of 1-2x/week for additional 4 weeks.    Stability/Clinical Decision Making Stable/Uncomplicated    Clinical Decision Making Low    Rehab Potential Good    PT Frequency Other (comment)   1-2x/week   PT Duration 4 weeks    PT Treatment/Interventions ADLs/Self Care Home Management;Cryotherapy;Electrical Stimulation;Ultrasound;Traction;Moist Heat;Iontophoresis 54m/ml Dexamethasone;Gait training;Stair training;Functional mobility training;Therapeutic activities;Therapeutic exercise;Balance training;Neuromuscular re-education;Manual techniques;Passive range of motion;Dry needling;Energy  conservation;Vasopneumatic Device;Taping;Patient/family education    PT Next Visit Plan DN if pt is agreeable/appropriate, progress hip and  core strength and pain-free lumbar/hip mobility    Consulted and Agree with Plan of Care Patient             Patient will benefit from skilled therapeutic intervention in order to improve the following deficits and impairments:  Abnormal gait, Decreased range of motion, Difficulty walking, Increased fascial restricitons, Increased muscle spasms, Decreased activity tolerance, Pain, Impaired flexibility, Improper body mechanics, Postural dysfunction, Decreased strength  Visit Diagnosis: Chronic left-sided low back pain with left-sided sciatica  Pain in left hip  Muscle weakness (generalized)  Difficulty in walking, not elsewhere classified     Problem List There are no problems to display for this patient.   Shelby Dubin Maxene Byington, PT 06/23/2021, 11:25 AM  [facility]. [place], [place], [address] Phone: [phone]   Fax:  [phone]  Name: Tiffany Mccullough MRN: [id number] Date of Birth: 06-02-1971

## 2021-06-23 NOTE — Progress Notes (Signed)
Office Visit Note   Patient: Tiffany Mccullough           Date of Birth: 09-04-1971           MRN: [id number] Visit Date: 06/23/2021 Requested by: Alroy Dust, L.Marlou Sa, [place] [employer] [place],  [place] [address] PCP: Alroy Dust, L.Marlou Sa, MD  Subjective: Chief Complaint  Patient presents with   Other    F/u hip and back pain    HPI: Tiffany Mccullough is a 50 y.o. female who returns to the office for follow-up visit.  Plan from last visit was to order MRI scans of L-spine and left hip but these were denied by her insurance.  She was sent to physical therapy and reports this did help a little bit with the stiffness she felt but did not resolve any of her pain or really improved at all.  She reports continued low back pain radiating into her buttocks primarily with radiation down to the posterior lateral knee as well.  She has occasional radiating pain down to the bottom of her foot as she did at her last visit.  She also endorses groin pain in the left hip.  She has a history of prior back problems with epidural steroid injections in the past that have provided good relief but none recently..   ROS: All systems reviewed are negative as they relate to the chief complaint within the history of present illness.  Patient denies fevers or chills.  Assessment & Plan: Visit Diagnoses:  1. Pain in left leg     Plan: Tiffany Mccullough is a 50 y.o. female who returns to the office for follow-up visit for left hip and low back pain with radicular pain.  Primarily radiating pain down to the buttocks in the posterior lateral knee with numbness and tingling that is in the same distribution.  She has had no improvement in her symptoms with formal therapy over the last several months.  With failure of conservative treatment and continued symptoms as well as weakness of hamstring on exam, plan to order MRI of the lumbar spine and MRI of left hip for further evaluation of differential diagnosis  which primarily includes lumbar disc herniation versus proximal hamstring tendinopathy.  Follow-up after MRIs to review results.  Follow-Up Instructions: No follow-ups on file.   Orders:  Orders Placed This Encounter  Procedures   MR Lumbar Spine w/o contrast   MR Hip Left w/o contrast   No orders of the defined types were placed in this encounter.     Procedures: No procedures performed   Clinical Data: No additional findings.  Objective: Vital Signs: There were no vitals taken for this visit.  Physical Exam:  Constitutional: Patient appears well-developed HEENT:  Head: Normocephalic Eyes:EOM are normal Neck: Normal range of motion Cardiovascular: Normal rate Pulmonary/chest: Effort normal Neurologic: Patient is alert Skin: Skin is warm Psychiatric: Patient has normal mood and affect  Ortho Exam: Ortho exam demonstrates tenderness throughout the axial lumbar spine and left-sided paraspinal musculature.  Negative straight leg raise on left.  Positive straight leg raise on right yielding left-sided symptoms.  Moderate tenderness over the left greater trochanter.  No pain with hip range of motion (flexion, internal rotation, external rotation.  Negative Stinchfield sign.  5/5 motor strength of bilateral hip flexion, quadricep, dorsiflexion, plantarflexion.  5/5 motor strength of right hamstring compared with 4/5 motor strength of left hamstring.  Tenderness over the ischial tuberosity on the left that is not  present on the right.  No evidence of clonus bilaterally.  2+ patellar tendon reflexes bilaterally.  Specialty Comments:  No specialty comments available.  Imaging: No results found.   PMFS History: There are no problems to display for this patient.  Past Medical History:  Diagnosis Date   Medical history non-contributory     No family history on file.  Past Surgical History:  Procedure Laterality Date   DILATION AND CURETTAGE OF UTERUS     MASS EXCISION  Bilateral 02/21/2014   Procedure: RIGHT INDEX EXCISION MASS/LEFT FINGER EXCISION MASS;  Surgeon: Tennis Must, MD;  Location: [facility];  Service: Orthopedics;  Laterality: Bilateral;   TUBAL LIGATION     tubal implants   WISDOM TOOTH EXTRACTION     Social History   Occupational History   Not on file  Tobacco Use   Smoking status: Former    Types: Cigarettes    Quit date: 01/21/2003    Years since quitting: 18.4   Smokeless tobacco: Never  Substance and Sexual Activity   Alcohol use: Yes   Drug use: Not on file   Sexual activity: Not on file

## 2021-07-02 ENCOUNTER — Ambulatory Visit: Payer: Managed Care, Other (non HMO) | Admitting: Rehabilitative and Restorative Service Providers"

## 2021-07-19 ENCOUNTER — Telehealth: Payer: Self-pay | Admitting: Orthopedic Surgery

## 2021-07-28 ENCOUNTER — Telehealth: Payer: Self-pay | Admitting: Orthopedic Surgery

## 2021-07-28 ENCOUNTER — Other Ambulatory Visit: Payer: Self-pay | Admitting: Surgical

## 2021-07-28 MED ORDER — DIAZEPAM 5 MG PO TABS: 5.0000 mg | ORAL_TABLET | ORAL | 0 refills | Status: DC

## 2021-07-28 NOTE — Telephone Encounter (Signed)
Pt state she is having an MRI done tomorrow and need medication to keep her calm. Please send a prescription to pharmacy on file. Pt phone number is [phone].

## 2021-07-28 NOTE — Telephone Encounter (Signed)
Sent in and called

## 2021-07-29 ENCOUNTER — Ambulatory Visit
Admission: RE | Admit: 2021-07-29 | Discharge: 2021-07-29 | Disposition: A | Discharge status: Home / Self Care | Payer: Managed Care, Other (non HMO) | Source: Ambulatory Visit | Attending: Orthopedic Surgery | Admitting: Orthopedic Surgery

## 2021-07-29 DIAGNOSIS — M79605 Pain in left leg: Secondary | ICD-10-CM

## 2021-08-02 ENCOUNTER — Ambulatory Visit: Payer: Managed Care, Other (non HMO) | Admitting: Surgical

## 2021-08-02 ENCOUNTER — Ambulatory Visit: Payer: Self-pay

## 2021-08-02 ENCOUNTER — Other Ambulatory Visit: Payer: Self-pay

## 2021-08-02 DIAGNOSIS — M25511 Pain in right shoulder: Secondary | ICD-10-CM | POA: Diagnosis not present

## 2021-08-05 IMAGING — MR MR SHOULDER*L* W/O CM
4 of 5 series · 22 of 40 positions shown · non-contrast
Comparison: 12/17/2020 radiographs and prior MRI 12/18/2014

CLINICAL DATA: Left shoulder pain and weakness with reduced range
of motion for 6 years, but worsening over the last 2-3 months.

EXAM:
MRI OF THE LEFT SHOULDER WITHOUT CONTRAST
TECHNIQUE: Multiplanar, multisequence MR imaging of the shoulder was performed.
No intravenous contrast was administered.

[Series 6: T2 fat-sat · axial · left · 3.0mm · 0.59mm/px · z∈[-117,-15]mm · 8 of 30 slices shown (1 of 3)]
[im 1/30]
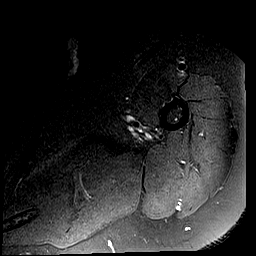
[im 4/30]
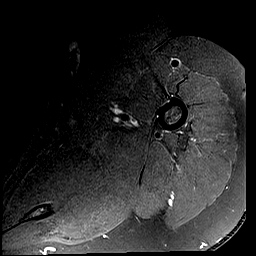
[im 10/30]
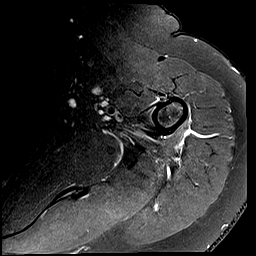
[im 13/30]
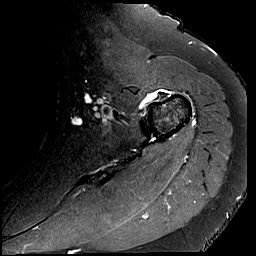
[im 17/30]
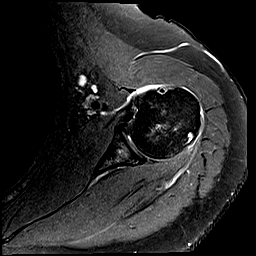
[im 20/30]
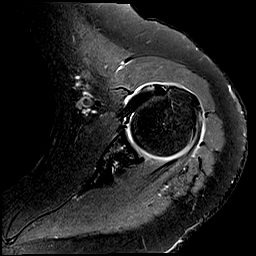
[im 26/30]
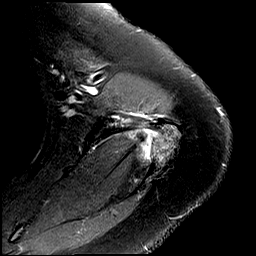
[im 30/30]
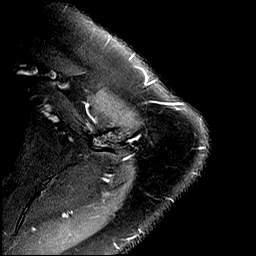

[Series 7: T2 fat-sat · oblique · left · 4.0mm · 0.22mm/px · 4 of 21 slices shown (2 of 3)]
[im 1/21]
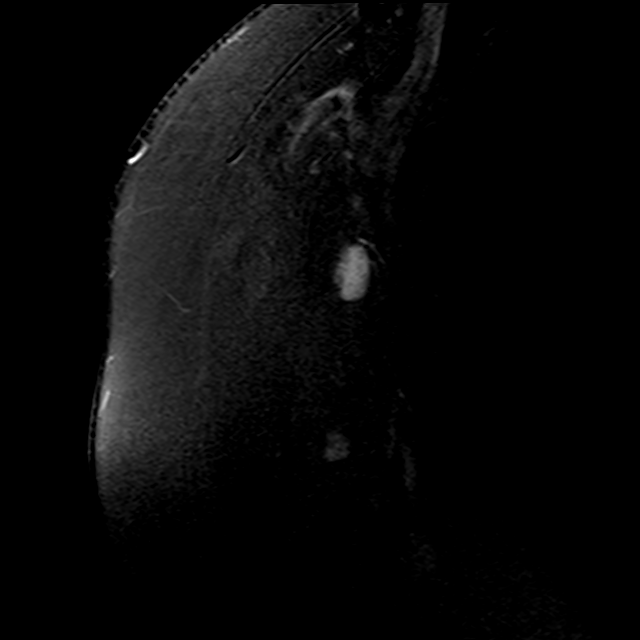
[im 4/21]
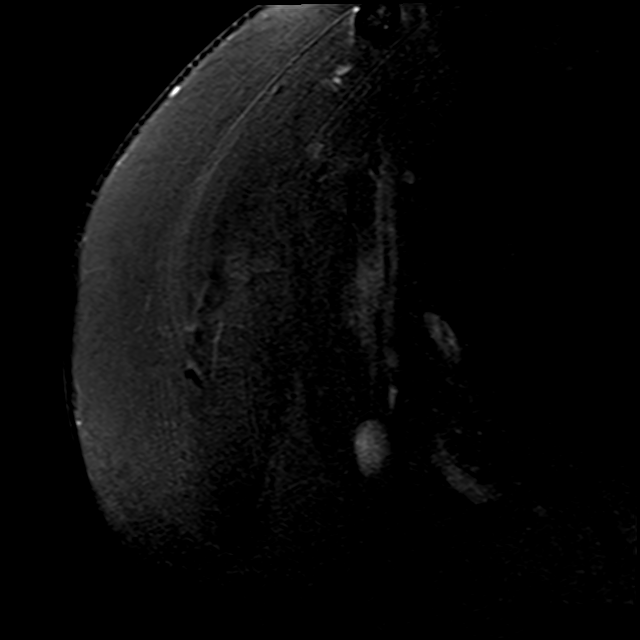
[im 11/21]
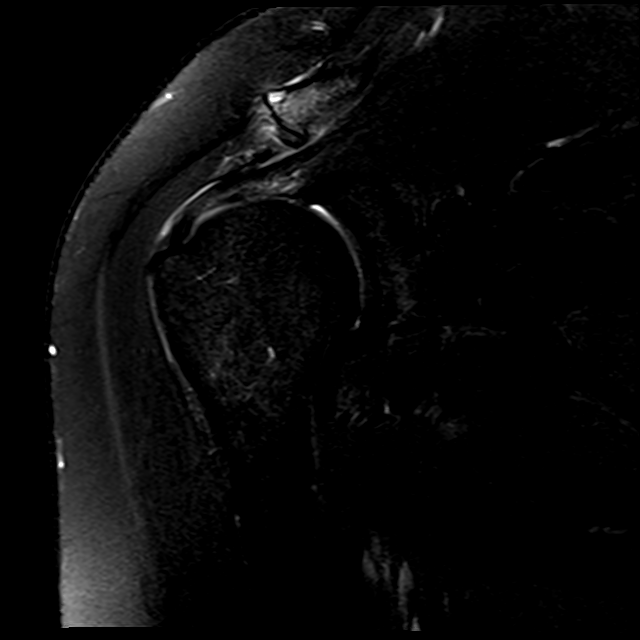
[im 17/21]
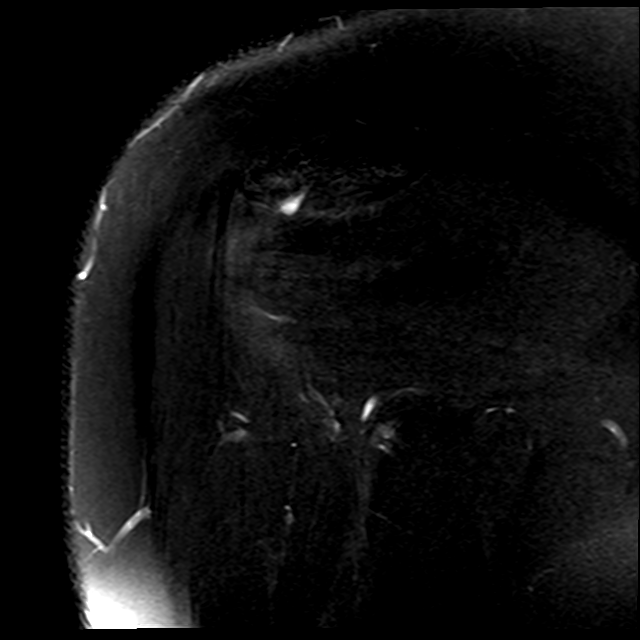

[Series 8: PD · oblique · left · 4.0mm · 0.22mm/px · 7 of 21 slices shown]
[im 1/21]
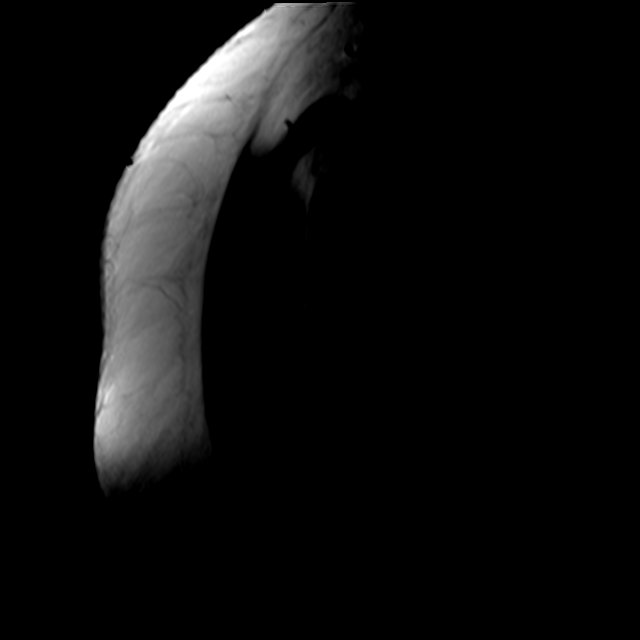
[im 4/21]
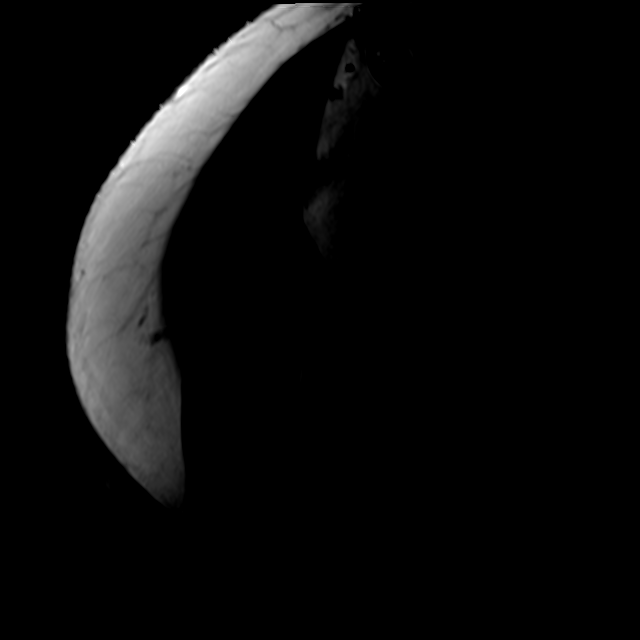
[im 7/21]
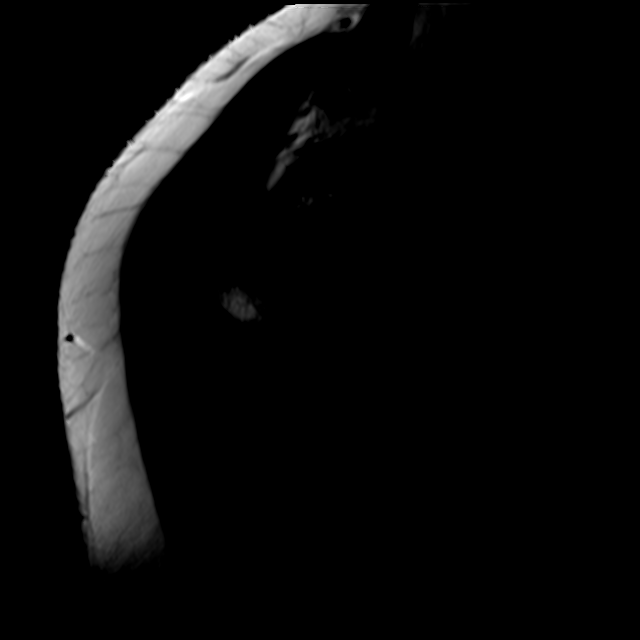
[im 11/21]
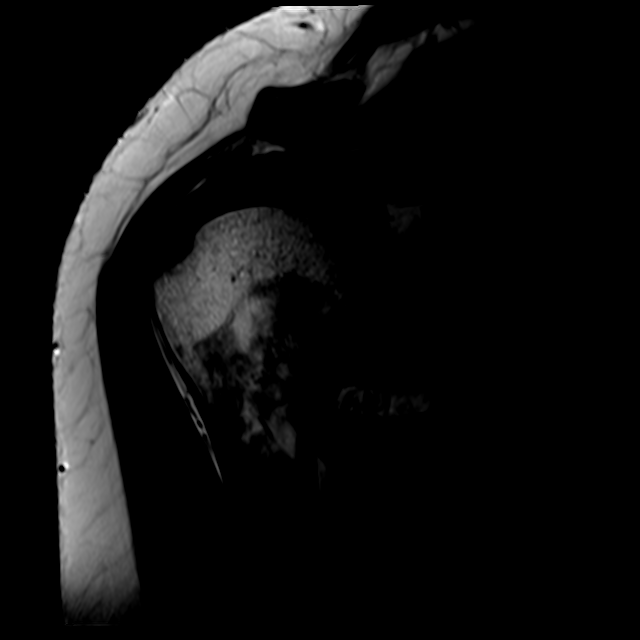
[im 14/21]
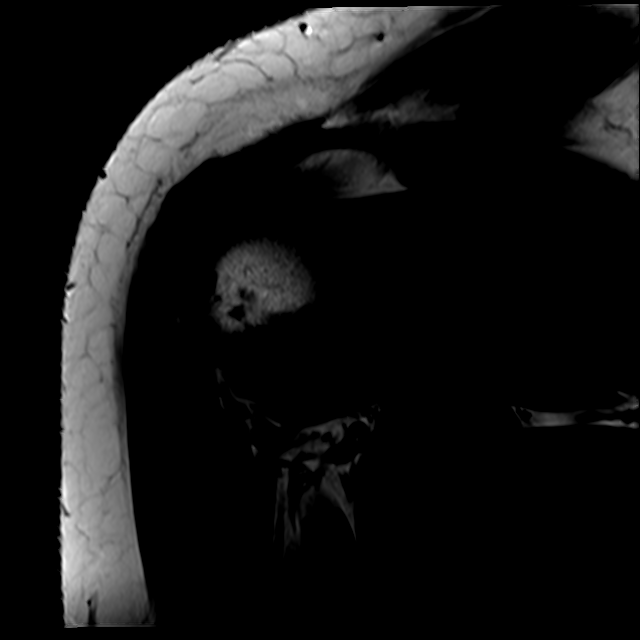
[im 17/21]
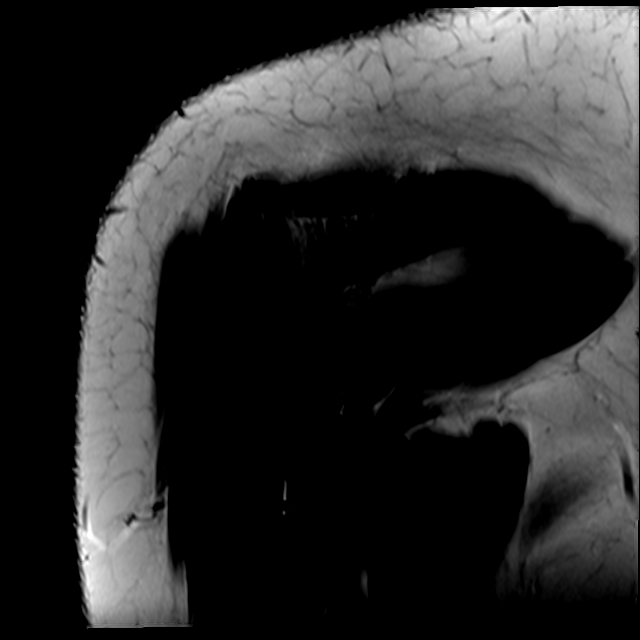
[im 21/21]
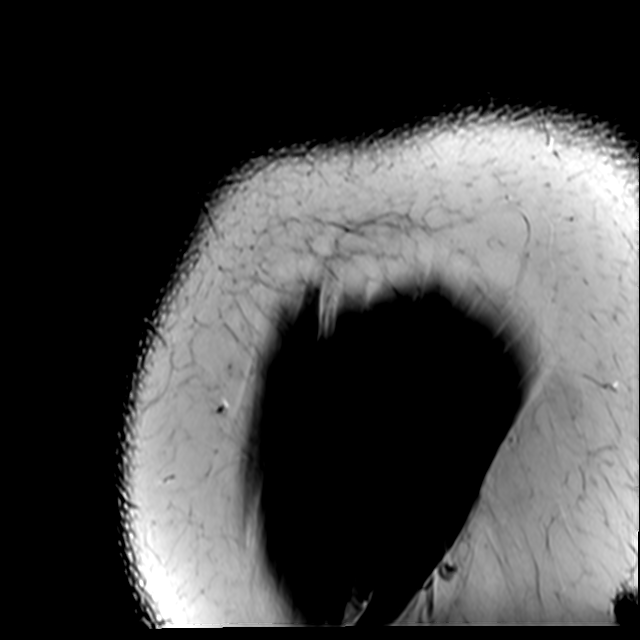

[Series 9: T2 fat-sat · coronal · left · 4.0mm · 0.55mm/px · 3 of 25 slices shown (3 of 3)]
[im 4/25]
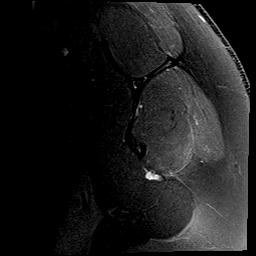
[im 14/25]
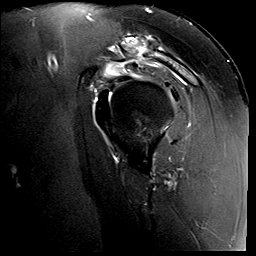
[im 21/25]
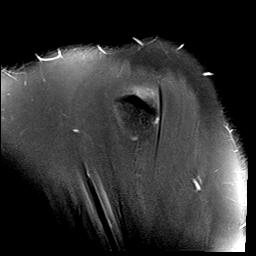

[22 of 40 positions shown; findings below may reference images not displayed]

FINDINGS: Rotator cuff:  Mild supraspinatus tendinopathy.

Muscles:  Unremarkable

Biceps long head:  Unremarkable

Acromioclavicular Joint: Moderate subcortical marrow edema and mild
spurring noted with a small amount of fluid in the AC joint. Type I
acromion. Mild subacromial subdeltoid bursitis extending into the
subcoracoid bursa.

Glenohumeral Joint: There is synovitis along the rotator interval
specially just above the biceps tendon on image 14 series 9. The
inferior glenohumeral ligament/axillary pouch region appear normal.

Labrum:  Unremarkable

Bones: No significant extra-articular osseous abnormalities
identified.

Other: No supplemental non-categorized findings.
IMPRESSION: 1. Mild subacromial subdeltoid bursitis also extending into the
subcoracoid bursa.
2. Synovitis in the rotator interval. Mild adjacent supraspinatus
tendinopathy. No rotator cuff tear is identified.
3. Moderate degenerative AC joint arthropathy, substantially
increased from 12/18/2014.

## 2021-08-08 ENCOUNTER — Encounter: Payer: Self-pay | Admitting: Orthopedic Surgery

## 2021-08-08 NOTE — Progress Notes (Signed)
That while  Office Visit Note   Patient: Tiffany Mccullough           Date of Birth: January 30, 1971           MRN: [id number] Visit Date: 08/02/2021 Requested by: Alroy Dust, L.Marlou Sa, [place] [employer] [address] [place],  [place] [id number] PCP: Alroy Dust, L.Marlou Sa, MD  Subjective: Chief Complaint  Patient presents with   Other     Scan review    HPI: Tiffany Mccullough is a 50 y.o. female who presents to the office for MRI review. Patient denies any changes in symptoms.  Continues to complain mainly of left hip, low back pain.  She also has new complaint of right shoulder pain since injuring her right shoulder at work several hours ago.  She has moderate to severe pain in the right shoulder that she localizes to the lateral aspect of the right shoulder.  She has pain with lifting her arm but no functional difficulties.  She has not had this problem before.  MRI results revealed: MR Lumbar Spine w/o contrast  Result Date: 07/30/2021 CLINICAL DATA:  Left leg pain, low back pain EXAM: MRI LUMBAR SPINE WITHOUT CONTRAST TECHNIQUE: Multiplanar, multisequence MR imaging of the lumbar spine was performed. No intravenous contrast was administered. COMPARISON:  04/30/2012. FINDINGS: Segmentation:  Standard. Alignment:  Physiologic. Vertebrae: Diffusely decreased marrow signal, similar to prior exam. No acute fracture or suspicious osseous lesion. Conus medullaris and cauda equina: Conus extends to the L1-L2 level. Conus and cauda equina appear normal. Paraspinal and other soft tissues: Negative. Disc levels: T12-L1: No significant disc bulge. Mild facet arthropathy. Mild no spinal canal stenosis or neural foraminal narrowing. L1-L2: No significant disc bulge. Facet arthropathy. No spinal canal stenosis or neural foraminal narrowing. L2-L3: Disc desiccation with minimal disc bulge. Mild-to-moderate facet arthropathy. No spinal canal stenosis or neural foraminal narrowing. L3-L4: Disc desiccation and mild  broad-based disc bulge. Moderate facet arthropathy. No spinal canal stenosis or neural foraminal narrowing. L4-L5: Disc desiccation and mild broad-based disc bulge. Moderate facet arthropathy. No spinal canal stenosis or neural foraminal narrowing. L5-S1: Disc desiccation and broad-based disc bulge with central annular fissure. Moderate facet arthropathy. No spinal canal stenosis or neural foraminal narrowing. IMPRESSION: 1. Mild to moderate facet arthropathy, which can cause pain. 2. No spinal canal stenosis or neural foraminal narrowing. 3. Diffusely decreased marrow signal, which is nonspecific but can be seen in the setting of diabetes, obesity, tobacco use, or a myeloproliferative disorder. Electronically Signed   By: Merilyn Baba M.D.   On: 07/30/2021 20:28   MR Hip Left w/o contrast  Result Date: 07/31/2021 CLINICAL DATA:  Left hip and leg pain since falling nearly 3 years ago. No previous relevant surgery. EXAM: MR OF THE LEFT HIP WITHOUT CONTRAST TECHNIQUE: Multiplanar, multisequence MR imaging was performed. No intravenous contrast was administered. COMPARISON:  Radiographs 03/25/2021 and 09/25/2018. FINDINGS: Bones: There is no evidence of acute fracture, dislocation or avascular necrosis. The visualized bony pelvis appears normal. The visualized sacroiliac joints and symphysis pubis appear normal. Articular cartilage and labrum Articular cartilage: Minimal degenerative changes of both hips. No focal chondral defect or significant subchondral signal abnormality identified. Labrum: There is no gross labral tear or paralabral abnormality. Joint or bursal effusion Joint effusion: No significant hip joint effusion. Bursae: No focal periarticular fluid collection. Muscles and tendons Muscles and tendons: Mild tendinosis of the gluteus medius and minimus tendons without tear. The visualized iliopsoas and hamstring tendons appear normal. Minimal edema involving  the quadratus femoris muscles appears nearly  symmetric. No focal muscular atrophy. Other findings Miscellaneous: The visualized internal pelvic contents appear unremarkable. IMPRESSION: 1. No acute osseous findings. Minimal degenerative changes of both hips. 2. Mild gluteus tendinosis on the left.  No evidence of tendon tear. 3. Mild T2 hyperintensity within the quadratus femoris muscles bilaterally without associated atrophy. Electronically Signed   By: Richardean Sale M.D.   On: 07/31/2021 09:15                 ROS: All systems reviewed are negative as they relate to the chief complaint within the history of present illness.  Patient denies fevers or chills.  Assessment & Plan: Visit Diagnoses:  1. Right shoulder pain, unspecified chronicity     Plan: Tiffany Mccullough is a 50 y.o. female who presents to the office for review of left hip and lumbar spine MRI scans.  No significant osteoarthritis of either hip noted on the scans and no significant gluteal tendinopathy.  Does have moderate facet arthropathy at multiple levels in the lumbar spine.  She states that she has had previous injections in her L-spine several years ago by Dr. Ernestina Patches in the old building and had good relief with these.  She would like to try these again.  Plan to refer her to Dr. Ernestina Patches for L-spine ESI's.  Regarding her right shoulder, is only been several hours since her initial injury.  Recommended she return to the office for repeat evaluation if she has continued pain 1 or 2 weeks out from the injury.  At this time, she has no rotator cuff weakness noted on exam.  Follow-up with the office as needed.  Follow-Up Instructions: No follow-ups on file.   Orders:  Orders Placed This Encounter  Procedures   XR Shoulder Right   No orders of the defined types were placed in this encounter.     Procedures: No procedures performed   Clinical Data: No additional findings.  Objective: Vital Signs: There were no vitals taken for this visit.  Physical Exam:   Constitutional: Patient appears well-developed HEENT:  Head: Normocephalic Eyes:EOM are normal Neck: Normal range of motion Cardiovascular: Normal rate Pulmonary/chest: Effort normal Neurologic: Patient is alert Skin: Skin is warm Psychiatric: Patient has normal mood and affect  Ortho Exam: Ortho exam demonstrates right shoulder with preserved range of motion equivalent to the contralateral side but painful with terminal range of motion.  5/5 supraspinatus, infraspinatus, subscapularis strength.  Mild tenderness over the [facility] joint.  Moderate tenderness over the bicipital groove.  No obvious deformity to the shoulder.  Specialty Comments:  No specialty comments available.  Imaging: No results found.   PMFS History: There are no problems to display for this patient.  Past Medical History:  Diagnosis Date   Medical history non-contributory     No family history on file.  Past Surgical History:  Procedure Laterality Date   DILATION AND CURETTAGE OF UTERUS     MASS EXCISION Bilateral 02/21/2014   Procedure: RIGHT INDEX EXCISION MASS/LEFT FINGER EXCISION MASS;  Surgeon: Tennis Must, MD;  Location: [facility];  Service: Orthopedics;  Laterality: Bilateral;   TUBAL LIGATION     tubal implants   WISDOM TOOTH EXTRACTION     Social History   Occupational History   Not on file  Tobacco Use   Smoking status: Former    Types: Cigarettes    Quit date: 01/21/2003    Years since quitting: 56.5  Smokeless tobacco: Never  Substance and Sexual Activity   Alcohol use: Yes   Drug use: Not on file   Sexual activity: Not on file

## 2021-08-09 ENCOUNTER — Other Ambulatory Visit: Payer: Self-pay

## 2021-08-09 DIAGNOSIS — M5410 Radiculopathy, site unspecified: Secondary | ICD-10-CM

## 2021-08-16 ENCOUNTER — Telehealth: Payer: Self-pay | Admitting: Physical Medicine and Rehabilitation

## 2021-08-16 NOTE — Telephone Encounter (Signed)
Patient called. Returning a call to schedule with Dr. Newton.  ?

## 2021-08-24 ENCOUNTER — Telehealth: Payer: Self-pay | Admitting: Physical Medicine and Rehabilitation

## 2021-08-24 NOTE — Telephone Encounter (Signed)
Scheduled

## 2021-08-24 NOTE — Telephone Encounter (Signed)
Pt called to set an appt. Pt states she work overnight and its best to call late afternoon. Please call pt at [phone].

## 2021-09-16 ENCOUNTER — Other Ambulatory Visit: Payer: Self-pay

## 2021-09-16 ENCOUNTER — Encounter: Payer: Self-pay | Admitting: Physical Medicine and Rehabilitation

## 2021-09-16 ENCOUNTER — Ambulatory Visit (INDEPENDENT_AMBULATORY_CARE_PROVIDER_SITE_OTHER): Payer: Managed Care, Other (non HMO) | Admitting: Physical Medicine and Rehabilitation

## 2021-09-16 ENCOUNTER — Ambulatory Visit: Payer: Self-pay

## 2021-09-16 VITALS — BP 135/84 | HR 75

## 2021-09-16 DIAGNOSIS — M5416 Radiculopathy, lumbar region: Secondary | ICD-10-CM

## 2021-09-16 MED ORDER — METHYLPREDNISOLONE ACETATE 80 MG/ML IJ SUSP
80.0000 mg | Freq: Once | INTRAMUSCULAR | Status: AC
  Administered 2021-09-16: 09:00:00 80 mg

## 2021-09-16 NOTE — Progress Notes (Signed)
Left L5-S1 interlam, 2013 on SRS left L4 and L5 tf esi. Pt state lower back pain that travels to her left leg to her ankle. Pt state walking, standing and laying down makes the pain worse. Pt state she takes over the counter pain meds and uses heat and ice to help ease her pain.  Numeric Pain Rating Scale and Functional Assessment Average Pain 2   In the last MONTH (on 0-10 scale) has pain interfered with the following?  1. General activity like being  able to carry out your everyday physical activities such as walking, climbing stairs, carrying groceries, or moving a chair?  Rating(7)   +Driver, -BT, -Dye Allergies.

## 2021-09-16 NOTE — Patient Instructions (Signed)

## 2021-09-27 NOTE — Progress Notes (Signed)
Tiffany Mccullough - 50 y.o. female MRN [id number]  Date of birth: Sep 11, 1971  Office Visit Note: Visit Date: 09/16/2021 PCP: Alroy Dust, L.Marlou Sa, MD Referred by: Alroy Dust, L.Marlou Sa, MD  Subjective: Chief Complaint  Patient presents with   Lower Back - Pain   Left Leg - Pain   Left Ankle - Pain   HPI:  Tiffany Mccullough is a 50 y.o. female who comes in today at the request of Annie Main, PA-C for planned Left L5-S1 Lumbar Interlaminar epidural steroid injection with fluoroscopic guidance.  The patient has failed conservative care including home exercise, medications, time and activity modification.  This injection will be diagnostic and hopefully therapeutic.  Please see requesting physician notes for further details and justification. Prior injection by me in 2013 was 2 level transforaminal. MRI reviewed with images and spine model.  MRI reviewed in the note below.   ROS Otherwise per HPI.  Assessment & Plan: Visit Diagnoses:    ICD-10-CM   1. Lumbar radiculopathy  M54.16 XR C-ARM NO REPORT    Epidural Steroid injection    methylPREDNISolone acetate (DEPO-MEDROL) injection 80 mg      Plan: No additional findings.   Meds & Orders:  Meds ordered this encounter  Medications   methylPREDNISolone acetate (DEPO-MEDROL) injection 80 mg    Orders Placed This Encounter  Procedures   XR C-ARM NO REPORT   Epidural Steroid injection    Follow-up: Return if symptoms worsen or fail to improve.   Procedures: No procedures performed  Lumbar Epidural Steroid Injection - Interlaminar Approach with Fluoroscopic Guidance  Patient: Tiffany Mccullough      Date of Birth: [birth year] MRN: [id number] PCP: Alroy Dust, L.Marlou Sa, MD      Visit Date: 09/16/2021   Universal Protocol:     Consent Given By: the patient  Position: PRONE  Additional Comments: Vital signs were monitored before and after the procedure. Patient was prepped and draped in the usual sterile fashion. The correct  patient, procedure, and site was verified.   Injection Procedure Details:   Procedure diagnoses: Lumbar radiculopathy [M54.16]   Meds Administered:  Meds ordered this encounter  Medications   methylPREDNISolone acetate (DEPO-MEDROL) injection 80 mg     Laterality: Left  Location/Site:  L5-S1  Needle: 3.5 in., 20 ga. Tuohy  Needle Placement: Paramedian epidural  Findings:   -Comments: Excellent flow of contrast into the epidural space.  Procedure Details: Using a paramedian approach from the side mentioned above, the region overlying the inferior lamina was localized under fluoroscopic visualization and the soft tissues overlying this structure were infiltrated with 4 ml. of 1% Lidocaine without Epinephrine. The Tuohy needle was inserted into the epidural space using a paramedian approach.   The epidural space was localized using loss of resistance along with counter oblique bi-planar fluoroscopic views.  After negative aspirate for air, blood, and CSF, a 2 ml. volume of Isovue-250 was injected into the epidural space and the flow of contrast was observed. Radiographs were obtained for documentation purposes.    The injectate was administered into the level noted above.   Additional Comments:  No complications occurred Dressing: 2 x 2 sterile gauze and Band-Aid    Post-procedure details: Patient was observed during the procedure. Post-procedure instructions were reviewed.  Patient left the clinic in stable condition.    Clinical History: MRI LUMBAR SPINE WITHOUT CONTRAST   TECHNIQUE: Multiplanar, multisequence MR imaging of the lumbar spine was performed. No intravenous contrast was administered.   COMPARISON:  04/30/2012.   FINDINGS: Segmentation:  Standard.   Alignment:  Physiologic.   Vertebrae: Diffusely decreased marrow signal, similar to prior exam. No acute fracture or suspicious osseous lesion.   Conus medullaris and cauda equina: Conus extends to  the L1-L2 level. Conus and cauda equina appear normal.   Paraspinal and other soft tissues: Negative.   Disc levels:   T12-L1: No significant disc bulge. Mild facet arthropathy. Mild no spinal canal stenosis or neural foraminal narrowing.   L1-L2: No significant disc bulge. Facet arthropathy. No spinal canal stenosis or neural foraminal narrowing.   L2-L3: Disc desiccation with minimal disc bulge. Mild-to-moderate facet arthropathy. No spinal canal stenosis or neural foraminal narrowing.   L3-L4: Disc desiccation and mild broad-based disc bulge. Moderate facet arthropathy. No spinal canal stenosis or neural foraminal narrowing.   L4-L5: Disc desiccation and mild broad-based disc bulge. Moderate facet arthropathy. No spinal canal stenosis or neural foraminal narrowing.   L5-S1: Disc desiccation and broad-based disc bulge with central annular fissure. Moderate facet arthropathy. No spinal canal stenosis or neural foraminal narrowing.   IMPRESSION: 1. Mild to moderate facet arthropathy, which can cause pain. 2. No spinal canal stenosis or neural foraminal narrowing. 3. Diffusely decreased marrow signal, which is nonspecific but can be seen in the setting of diabetes, obesity, tobacco use, or a myeloproliferative disorder.     Electronically Signed   By: Merilyn Baba M.D.   On: 07/30/2021 20:28     Objective:  VS:  HT:     WT:    BMI:      BP:135/84   HR:75bpm   TEMP: ( )   RESP:  Physical Exam Vitals and nursing note reviewed.  Constitutional:      General: She is not in acute distress.    Appearance: Normal appearance. She is not ill-appearing.  HENT:     Head: Normocephalic and atraumatic.     Right Ear: External ear normal.     Left Ear: External ear normal.  Eyes:     Extraocular Movements: Extraocular movements intact.  Cardiovascular:     Rate and Rhythm: Normal rate.     Pulses: Normal pulses.  Pulmonary:     Effort: Pulmonary effort is normal. No  respiratory distress.  Abdominal:     General: There is no distension.     Palpations: Abdomen is soft.  Musculoskeletal:        General: Tenderness present.     Cervical back: Neck supple.     Right lower leg: No edema.     Left lower leg: No edema.     Comments: Patient has good distal strength with no pain over the greater trochanters.  No clonus or focal weakness.  Skin:    Findings: No erythema, lesion or rash.  Neurological:     General: No focal deficit present.     Mental Status: She is alert and oriented to person, place, and time.     Sensory: No sensory deficit.     Motor: No weakness or abnormal muscle tone.     Coordination: Coordination normal.  Psychiatric:        Mood and Affect: Mood normal.        Behavior: Behavior normal.     Imaging: No results found.

## 2021-09-27 NOTE — Procedures (Signed)
Lumbar Epidural Steroid Injection - Interlaminar Approach with Fluoroscopic Guidance  Patient: Tiffany Mccullough      Date of Birth: April 09, 1971 MRN: [id number] PCP: Alroy Dust, L.Marlou Sa, MD      Visit Date: 09/16/2021   Universal Protocol:     Consent Given By: the patient  Position: PRONE  Additional Comments: Vital signs were monitored before and after the procedure. Patient was prepped and draped in the usual sterile fashion. The correct patient, procedure, and site was verified.   Injection Procedure Details:   Procedure diagnoses: Lumbar radiculopathy [M54.16]   Meds Administered:  Meds ordered this encounter  Medications   methylPREDNISolone acetate (DEPO-MEDROL) injection 80 mg     Laterality: Left  Location/Site:  L5-S1  Needle: 3.5 in., 20 ga. Tuohy  Needle Placement: Paramedian epidural  Findings:   -Comments: Excellent flow of contrast into the epidural space.  Procedure Details: Using a paramedian approach from the side mentioned above, the region overlying the inferior lamina was localized under fluoroscopic visualization and the soft tissues overlying this structure were infiltrated with 4 ml. of 1% Lidocaine without Epinephrine. The Tuohy needle was inserted into the epidural space using a paramedian approach.   The epidural space was localized using loss of resistance along with counter oblique bi-planar fluoroscopic views.  After negative aspirate for air, blood, and CSF, a 2 ml. volume of Isovue-250 was injected into the epidural space and the flow of contrast was observed. Radiographs were obtained for documentation purposes.    The injectate was administered into the level noted above.   Additional Comments:  No complications occurred Dressing: 2 x 2 sterile gauze and Band-Aid    Post-procedure details: Patient was observed during the procedure. Post-procedure instructions were reviewed.  Patient left the clinic in stable condition.

## 2022-02-21 NOTE — Telephone Encounter (Signed)
err

## 2023-03-30 ENCOUNTER — Encounter: Payer: Self-pay | Admitting: Physical Medicine and Rehabilitation

## 2023-03-30 ENCOUNTER — Ambulatory Visit: Payer: Managed Care, Other (non HMO) | Admitting: Physical Medicine and Rehabilitation

## 2023-03-30 DIAGNOSIS — G8929 Other chronic pain: Secondary | ICD-10-CM

## 2023-03-30 DIAGNOSIS — M47816 Spondylosis without myelopathy or radiculopathy, lumbar region: Secondary | ICD-10-CM | POA: Diagnosis not present

## 2023-03-30 DIAGNOSIS — M5442 Lumbago with sciatica, left side: Secondary | ICD-10-CM | POA: Diagnosis not present

## 2023-03-30 DIAGNOSIS — M5416 Radiculopathy, lumbar region: Secondary | ICD-10-CM | POA: Diagnosis not present

## 2023-03-30 MED ORDER — MELOXICAM 15 MG PO TABS: 15.0000 mg | ORAL_TABLET | Freq: Every day | ORAL | 0 refills | Status: DC

## 2023-03-30 NOTE — Progress Notes (Signed)
Functional Pain Scale - descriptive words and definitions  Distracting (5)    Aware of pain/able to complete some ADL's but limited by pain/sleep is affected and active distractions are only slightly useful. Moderate range order  Average Pain 5  Lower back pain on left side that radiates in the left leg to the foot. Back spasms in the lower back

## 2023-03-30 NOTE — Progress Notes (Signed)
Tiffany Mccullough - 52 y.o. female MRN [id number]  Date of birth: September 20, 1971  Office Visit Note: Visit Date: 03/30/2023 PCP: Tiffany Mccullough, L.Tiffany Saucer, MD Referred by: Tiffany Mccullough, L.Tiffany Saucer, MD  Subjective: Chief Complaint  Patient presents with   Lower Back - Pain   HPI: Tiffany Mccullough is a 52 y.o. female who comes in today for evaluation of chronic, worsening and severe bilateral lower back pain radiating to left buttock down lateral leg to toes. Pain ongoing for several years, worsens with prolonged sitting and laying down. She describes pain as sore and aching, currently rates as 8 out of 10. Numbness and tingling to left toes and foot. Reports recent lower back spasms. Some relief of pain with home exercise regimen, ice, rest and use of medications. States she does YOGA daily which does seem to help alleviate her pain. Lumbar MRI imaging 2022 exhibits multi level mild to moderate facet arthropathy, no spinal stenosis or foraminal narrowing. Patient underwent left L5-S1 interlaminar epidural steroid injection in our office on 09/16/2021, she reports significant relief of pain, greater than 80% for over 6 months with this procedure.  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  Patient denies focal weakness, numbness and tingling.  No recent trauma or falls.   Review of Systems  Musculoskeletal:  Positive for back pain.  Neurological:  Positive for tingling. Negative for sensory change, focal weakness and weakness.  All other systems reviewed and are negative.  Otherwise per HPI.  Assessment & Plan: Visit Diagnoses:    ICD-10-CM   1. Lumbar radiculopathy  M54.16 Ambulatory referral to Physical Medicine Rehab    2. Chronic bilateral low back pain with left-sided sciatica  M54.42 Ambulatory referral to Physical Medicine Rehab   G89.29     3. Facet arthropathy, lumbar  M47.816 Ambulatory referral to Physical Medicine Rehab       Plan: Findings:  Chronic,  worsening and severe bilateral lower back pain radiating to left buttock down lateral leg to toes.  Patient continues to have severe pain despite good conservative therapy such as home exercise regimen, rest and use of medications.  Patient's clinical presentation and exam are consistent with L5 nerve pattern.  Other differentials include facet mediated pain, could be more of a facet joint syndrome.  Her pain does not directly correlate with prior lumbar MRI imaging from 2022, however she did get significant and sustained relief with previous epidural steroid injection.  Next step is to perform diagnostic and hopefully therapeutic left L5-S1 interlaminar epidural steroid injection under fluoroscopic guidance.  She is not currently taking anticoagulant medication.  If good relief of pain with injection we can repeat this procedure infrequently as needed.  She has no questions regarding injection procedure at this time.  I discussed medication management with her and prescribed short course of meloxicam for her to take until we are able to get her in for injection.  I encouraged patient to remain active, she can continue with home exercise regimen and yoga as tolerated.  No red flag symptoms noted upon exam today.    Meds & Orders:  Meds ordered this encounter  Medications   meloxicam (MOBIC) 15 MG tablet    Sig: Take 1 tablet (15 mg total) by mouth daily.    Dispense:  30 tablet    Refill:  0    Orders Placed This Encounter  Procedures   Ambulatory referral to Physical Medicine Rehab    Follow-up: Return for Left L5-S1 interlaminar epidural  steroid injection.   Procedures: No procedures performed      Clinical History: CLINICAL DATA:  Left leg pain, low back pain   EXAM: MRI LUMBAR SPINE WITHOUT CONTRAST   TECHNIQUE: Multiplanar, multisequence MR imaging of the lumbar spine was performed. No intravenous contrast was administered.   COMPARISON:  04/30/2012.   FINDINGS: Segmentation:   Standard.   Alignment:  Physiologic.   Vertebrae: Diffusely decreased marrow signal, similar to prior exam. No acute fracture or suspicious osseous lesion.   Conus medullaris and cauda equina: Conus extends to the L1-L2 level. Conus and cauda equina appear normal.   Paraspinal and other soft tissues: Negative.   Disc levels:   T12-L1: No significant disc bulge. Mild facet arthropathy. Mild no spinal canal stenosis or neural foraminal narrowing.   L1-L2: No significant disc bulge. Facet arthropathy. No spinal canal stenosis or neural foraminal narrowing.   L2-L3: Disc desiccation with minimal disc bulge. Mild-to-moderate facet arthropathy. No spinal canal stenosis or neural foraminal narrowing.   L3-L4: Disc desiccation and mild broad-based disc bulge. Moderate facet arthropathy. No spinal canal stenosis or neural foraminal narrowing.   L4-L5: Disc desiccation and mild broad-based disc bulge. Moderate facet arthropathy. No spinal canal stenosis or neural foraminal narrowing.   L5-S1: Disc desiccation and broad-based disc bulge with central annular fissure. Moderate facet arthropathy. No spinal canal stenosis or neural foraminal narrowing.   IMPRESSION: 1. Mild to moderate facet arthropathy, which can cause pain. 2. No spinal canal stenosis or neural foraminal narrowing. 3. Diffusely decreased marrow signal, which is nonspecific but can be seen in the setting of diabetes, obesity, tobacco use, or a myeloproliferative disorder.     Electronically Signed   By: Wiliam Ke M.D.   On: 07/30/2021 20:28   She reports that she quit smoking about 20 years ago. Her smoking use included cigarettes. She has never used smokeless tobacco. No results for input(s): "HGBA1C", "LABURIC" in the last 8760 hours.  Objective:  VS:  HT:    WT:   BMI:     BP:   HR: bpm  TEMP: ( )  RESP:  Physical Exam Vitals and nursing note reviewed.  HENT:     Head: Normocephalic and  atraumatic.     Right Ear: External ear normal.     Left Ear: External ear normal.     Nose: Nose normal.     Mouth/Throat:     Mouth: Mucous membranes are moist.  Eyes:     Extraocular Movements: Extraocular movements intact.  Cardiovascular:     Rate and Rhythm: Normal rate.     Pulses: Normal pulses.  Pulmonary:     Effort: Pulmonary effort is normal.  Abdominal:     General: Abdomen is flat. There is no distension.  Musculoskeletal:        General: Tenderness present.     Cervical back: Normal range of motion.     Comments: Patient rises from seated position to standing without difficulty. Good lumbar range of motion. No pain noted with facet loading. 5/5 strength noted with bilateral hip flexion, knee flexion/extension, ankle dorsiflexion/plantarflexion and EHL. No clonus noted bilaterally. No pain upon palpation of greater trochanters. No pain with internal/external rotation of bilateral hips. Sensation intact bilaterally. Dysesthesias noted to left L5 dermatome. Negative slump test bilaterally. Ambulates without aid, gait steady.     Skin:    General: Skin is warm and dry.     Capillary Refill: Capillary refill takes less than 2 seconds.  Neurological:     General: No focal deficit present.     Mental Status: She is alert and oriented to person, place, and time.  Psychiatric:        Mood and Affect: Mood normal.        Behavior: Behavior normal.     Ortho Exam  Imaging: No results found.  Past Medical/Family/Surgical/Social History: Medications & Allergies reviewed per EMR, new medications updated. There are no problems to display for this patient.  Past Medical History:  Diagnosis Date   Medical history non-contributory    History reviewed. No pertinent family history. Past Surgical History:  Procedure Laterality Date   DILATION AND CURETTAGE OF UTERUS     MASS EXCISION Bilateral 02/21/2014   Procedure: RIGHT INDEX EXCISION MASS/LEFT FINGER EXCISION MASS;   Surgeon: Tami Ribas, MD;  Location: [facility];  Service: Orthopedics;  Laterality: Bilateral;   TUBAL LIGATION     tubal implants   WISDOM TOOTH EXTRACTION     Social History   Occupational History   Not on file  Tobacco Use   Smoking status: Former    Types: Cigarettes    Quit date: 01/21/2003    Years since quitting: 20.2   Smokeless tobacco: Never  Substance and Sexual Activity   Alcohol use: Yes   Drug use: Not on file   Sexual activity: Not on file

## 2023-04-20 ENCOUNTER — Ambulatory Visit: Payer: Managed Care, Other (non HMO) | Admitting: Physical Medicine and Rehabilitation

## 2023-04-20 ENCOUNTER — Other Ambulatory Visit: Payer: Self-pay

## 2023-04-20 VITALS — BP 131/76 | HR 57

## 2023-04-20 DIAGNOSIS — M5416 Radiculopathy, lumbar region: Secondary | ICD-10-CM | POA: Diagnosis not present

## 2023-04-20 MED ORDER — METHYLPREDNISOLONE ACETATE 80 MG/ML IJ SUSP
80.0000 mg | Freq: Once | INTRAMUSCULAR | Status: AC
  Administered 2023-04-20: 80 mg

## 2023-04-20 NOTE — Progress Notes (Signed)
Functional Pain Scale - descriptive words and definitions  Distracting (5)    Aware of pain/able to complete some ADL's but limited by pain/sleep is affected and active distractions are only slightly useful. Moderate range order  Average Pain 3-4   +Driver, -BT, -Dye Allergies.  Lower back pain on left side that radiates down the left leg

## 2023-04-20 NOTE — Patient Instructions (Signed)

## 2023-04-26 ENCOUNTER — Other Ambulatory Visit: Payer: Self-pay | Admitting: Physical Medicine and Rehabilitation

## 2023-05-01 NOTE — Progress Notes (Signed)
Tiffany Mccullough - 52 y.o. female MRN [id number]  Date of birth: 06-06-1971  Office Visit Note: Visit Date: 04/20/2023 PCP: Clovis Riley, L.August Saucer, MD Referred by: Clovis Riley, L.August Saucer, MD  Subjective: Chief Complaint  Patient presents with   Lower Back - Pain   HPI:  Tiffany Mccullough is a 52 y.o. female who comes in today at the request of Ellin Goodie, FNP for planned Left L5-S1 Lumbar Interlaminar epidural steroid injection with fluoroscopic guidance.  The patient has failed conservative care including home exercise, medications, time and activity modification.  This injection will be diagnostic and hopefully therapeutic.  Please see requesting physician notes for further details and justification.   ROS Otherwise per HPI.  Assessment & Plan: Visit Diagnoses:    ICD-10-CM   1. Lumbar radiculopathy  M54.16 XR C-ARM NO REPORT    Epidural Steroid injection    methylPREDNISolone acetate (DEPO-MEDROL) injection 80 mg      Plan: No additional findings.   Meds & Orders:  Meds ordered this encounter  Medications   methylPREDNISolone acetate (DEPO-MEDROL) injection 80 mg    Orders Placed This Encounter  Procedures   XR C-ARM NO REPORT   Epidural Steroid injection    Follow-up: Return for visit to requesting provider as needed.   Procedures: No procedures performed  Lumbar Epidural Steroid Injection - Interlaminar Approach with Fluoroscopic Guidance  Patient: Tiffany Mccullough      Date of Birth: 1970/11/24 MRN: [id number] PCP: Clovis Riley, L.August Saucer, MD      Visit Date: 04/20/2023   Universal Protocol:     Consent Given By: the patient  Position: PRONE  Additional Comments: Vital signs were monitored before and after the procedure. Patient was prepped and draped in the usual sterile fashion. The correct patient, procedure, and site was verified.   Injection Procedure Details:   Procedure diagnoses: Lumbar radiculopathy [M54.16]   Meds Administered:  Meds  ordered this encounter  Medications   methylPREDNISolone acetate (DEPO-MEDROL) injection 80 mg     Laterality: Left  Location/Site:  L5-S1  Needle: 3.5 in., 20 ga. Tuohy  Needle Placement: Paramedian epidural  Findings:   -Comments: Excellent flow of contrast into the epidural space.  Procedure Details: Using a paramedian approach from the side mentioned above, the region overlying the inferior lamina was localized under fluoroscopic visualization and the soft tissues overlying this structure were infiltrated with 4 ml. of 1% Lidocaine without Epinephrine. The Tuohy needle was inserted into the epidural space using a paramedian approach.   The epidural space was localized using loss of resistance along with counter oblique bi-planar fluoroscopic views.  After negative aspirate for air, blood, and CSF, a 2 ml. volume of Isovue-250 was injected into the epidural space and the flow of contrast was observed. Radiographs were obtained for documentation purposes.    The injectate was administered into the level noted above.   Additional Comments:  No complications occurred Dressing: 2 x 2 sterile gauze and Band-Aid    Post-procedure details: Patient was observed during the procedure. Post-procedure instructions were reviewed.  Patient left the clinic in stable condition.   Clinical History: CLINICAL DATA:  Left leg pain, low back pain   EXAM: MRI LUMBAR SPINE WITHOUT CONTRAST   TECHNIQUE: Multiplanar, multisequence MR imaging of the lumbar spine was performed. No intravenous contrast was administered.   COMPARISON:  04/30/2012.   FINDINGS: Segmentation:  Standard.   Alignment:  Physiologic.   Vertebrae: Diffusely decreased marrow signal, similar to prior exam. No acute  fracture or suspicious osseous lesion.   Conus medullaris and cauda equina: Conus extends to the L1-L2 level. Conus and cauda equina appear normal.   Paraspinal and other soft tissues: Negative.    Disc levels:   T12-L1: No significant disc bulge. Mild facet arthropathy. Mild no spinal canal stenosis or neural foraminal narrowing.   L1-L2: No significant disc bulge. Facet arthropathy. No spinal canal stenosis or neural foraminal narrowing.   L2-L3: Disc desiccation with minimal disc bulge. Mild-to-moderate facet arthropathy. No spinal canal stenosis or neural foraminal narrowing.   L3-L4: Disc desiccation and mild broad-based disc bulge. Moderate facet arthropathy. No spinal canal stenosis or neural foraminal narrowing.   L4-L5: Disc desiccation and mild broad-based disc bulge. Moderate facet arthropathy. No spinal canal stenosis or neural foraminal narrowing.   L5-S1: Disc desiccation and broad-based disc bulge with central annular fissure. Moderate facet arthropathy. No spinal canal stenosis or neural foraminal narrowing.   IMPRESSION: 1. Mild to moderate facet arthropathy, which can cause pain. 2. No spinal canal stenosis or neural foraminal narrowing. 3. Diffusely decreased marrow signal, which is nonspecific but can be seen in the setting of diabetes, obesity, tobacco use, or a myeloproliferative disorder.     Electronically Signed   By: Wiliam Ke M.D.   On: 07/30/2021 20:28     Objective:  VS:  HT:    WT:   BMI:     BP:131/76  HR:(!) 57bpm  TEMP: ( )  RESP:  Physical Exam Vitals and nursing note reviewed.  Constitutional:      General: She is not in acute distress.    Appearance: Normal appearance. She is not ill-appearing.  HENT:     Head: Normocephalic and atraumatic.     Right Ear: External ear normal.     Left Ear: External ear normal.  Eyes:     Extraocular Movements: Extraocular movements intact.  Cardiovascular:     Rate and Rhythm: Normal rate.     Pulses: Normal pulses.  Pulmonary:     Effort: Pulmonary effort is normal. No respiratory distress.  Abdominal:     General: There is no distension.     Palpations: Abdomen is soft.   Musculoskeletal:        General: Tenderness present.     Cervical back: Neck supple.     Right lower leg: No edema.     Left lower leg: No edema.     Comments: Patient has good distal strength with no pain over the greater trochanters.  No clonus or focal weakness.  Skin:    Findings: No erythema, lesion or rash.  Neurological:     General: No focal deficit present.     Mental Status: She is alert and oriented to person, place, and time.     Sensory: No sensory deficit.     Motor: No weakness or abnormal muscle tone.     Coordination: Coordination normal.  Psychiatric:        Mood and Affect: Mood normal.        Behavior: Behavior normal.      Imaging: No results found.

## 2023-05-01 NOTE — Procedures (Signed)
Lumbar Epidural Steroid Injection - Interlaminar Approach with Fluoroscopic Guidance  Patient: Tiffany Mccullough      Date of Birth: 1971-03-18 MRN: [id number] PCP: Clovis Riley, L.August Saucer, MD      Visit Date: 04/20/2023   Universal Protocol:     Consent Given By: the patient  Position: PRONE  Additional Comments: Vital signs were monitored before and after the procedure. Patient was prepped and draped in the usual sterile fashion. The correct patient, procedure, and site was verified.   Injection Procedure Details:   Procedure diagnoses: Lumbar radiculopathy [M54.16]   Meds Administered:  Meds ordered this encounter  Medications   methylPREDNISolone acetate (DEPO-MEDROL) injection 80 mg     Laterality: Left  Location/Site:  L5-S1  Needle: 3.5 in., 20 ga. Tuohy  Needle Placement: Paramedian epidural  Findings:   -Comments: Excellent flow of contrast into the epidural space.  Procedure Details: Using a paramedian approach from the side mentioned above, the region overlying the inferior lamina was localized under fluoroscopic visualization and the soft tissues overlying this structure were infiltrated with 4 ml. of 1% Lidocaine without Epinephrine. The Tuohy needle was inserted into the epidural space using a paramedian approach.   The epidural space was localized using loss of resistance along with counter oblique bi-planar fluoroscopic views.  After negative aspirate for air, blood, and CSF, a 2 ml. volume of Isovue-250 was injected into the epidural space and the flow of contrast was observed. Radiographs were obtained for documentation purposes.    The injectate was administered into the level noted above.   Additional Comments:  No complications occurred Dressing: 2 x 2 sterile gauze and Band-Aid    Post-procedure details: Patient was observed during the procedure. Post-procedure instructions were reviewed.  Patient left the clinic in stable condition.

## 2023-05-24 ENCOUNTER — Other Ambulatory Visit: Payer: Self-pay | Admitting: Physical Medicine and Rehabilitation

## 2024-03-08 ENCOUNTER — Ambulatory Visit: Attending: Physician Assistant | Admitting: Audiologist

## 2024-03-08 DIAGNOSIS — H905 Unspecified sensorineural hearing loss: Secondary | ICD-10-CM | POA: Diagnosis present

## 2024-03-08 NOTE — Procedures (Signed)
  Outpatient Audiology and [facility] [address] [place], [place]  [address] [phone]  AUDIOLOGICAL  EVALUATION  NAME: Tiffany Mccullough     DOB:   25-Nov-1970      MRN: [id number]                                                                                     DATE: 03/08/2024     REFERENT: Diamond Formica, PA STATUS: Outpatient DIAGNOSIS:  Unilateral Sensorineural Hearing Loss Left Ear, Tinnitus  History: Tiffany Mccullough was seen for an audiological evaluation due to difficulty hearing from Tiffany left ear. This has been slowly progressing for several years. There was no incident or injury that started Tiffany change. She just noticed randomly her left ear was not hearing well. She has high pitched tinnitus, she cannot tell where its from. It seems ot be from her head. Tiffany left ear is also itchy. Tiffany itch it deep in Tiffany ear and she cannot reach it.  Tiffany Mccullough denies pain or pressure in either ear.  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  Medical history shows no additional risk for hearing loss.    Evaluation:  Otoscopy showed a clear view of Tiffany tympanic membranes, bilaterally. Left ear skin does not look dry, no noticeable reason for itching.  Tympanometry results were consistent with normal middle ear function, bilaterally   Audiometric testing was completed using Conventional Audiometry techniques with insert earphones and supraural headphones. Test results are consistent with normal hearing in Tiffany right ear and normal sloping to moderate sensorineural hearing loss in Tiffany left ear. Speech Recognition Thresholds were obtained at 20 dB HL in Tiffany right ear and at 30dB HL in Tiffany left ear. Word Recognition Testing was completed at  40dB SL and Tiffany Mccullough scored 100% in each ear.    Results:  Tiffany test results were reviewed with Tiffany Mccullough. Tiffany Mccullough has sensorineural hearing loss in Tiffany left ear, right ear is normal. Tiffany Mccullough needs to see Otolaryngology. She  was counseled on Tiffany nature and degree of loss. See below.  Audiogram printed and provided to Tiffany Mccullough.     Recommendations: Recommend referral to Otolaryngology due to unilateral left ear moderate sensorineural hearing loss.      33 minutes spent testing and counseling on results.   If you have any questions please feel free to contact me at [phone].  Tiffany Mccullough Au.D.  Audiologist   03/08/2024  8:28 AM  Cc: Mitchell, L.Rozelle Corning, MD (Inactive)
